# Patient Record
Sex: Female | Born: 1965 | Race: Black or African American | Hispanic: No | Marital: Married | Smoking: Never smoker
Health system: Southern US, Community
[De-identification: ages and names within clinical notes are randomized; demographics above are authoritative.]

## PROBLEM LIST (undated history)

## (undated) DIAGNOSIS — D649 Anemia, unspecified: Secondary | ICD-10-CM

## (undated) DIAGNOSIS — O903 Peripartum cardiomyopathy: Secondary | ICD-10-CM

## (undated) DIAGNOSIS — N939 Abnormal uterine and vaginal bleeding, unspecified: Secondary | ICD-10-CM

## (undated) DIAGNOSIS — C801 Malignant (primary) neoplasm, unspecified: Secondary | ICD-10-CM

## (undated) DIAGNOSIS — I499 Cardiac arrhythmia, unspecified: Secondary | ICD-10-CM

## (undated) DIAGNOSIS — I10 Essential (primary) hypertension: Secondary | ICD-10-CM

## (undated) DIAGNOSIS — R51 Headache: Secondary | ICD-10-CM

## (undated) HISTORY — PX: MULTIPLE TOOTH EXTRACTIONS: SHX2053

## (undated) HISTORY — PX: OTHER SURGICAL HISTORY: SHX169

## (undated) HISTORY — PX: TUBAL LIGATION: SHX77

---

## 1988-05-30 DIAGNOSIS — O903 Peripartum cardiomyopathy: Secondary | ICD-10-CM

## 1988-05-30 HISTORY — DX: Peripartum cardiomyopathy: O90.3

## 2001-01-19 ENCOUNTER — Other Ambulatory Visit: Admission: RE | Admit: 2001-01-19 | Discharge: 2001-01-19 | Payer: Self-pay | Admitting: *Deleted

## 2001-01-24 ENCOUNTER — Encounter: Admission: RE | Admit: 2001-01-24 | Discharge: 2001-01-24 | Payer: Self-pay | Admitting: *Deleted

## 2001-01-24 ENCOUNTER — Encounter: Payer: Self-pay | Admitting: *Deleted

## 2003-09-04 ENCOUNTER — Emergency Department (HOSPITAL_COMMUNITY): Admission: EM | Admit: 2003-09-04 | Discharge: 2003-09-04 | Payer: Self-pay | Admitting: Emergency Medicine

## 2003-10-01 ENCOUNTER — Ambulatory Visit (HOSPITAL_COMMUNITY): Admission: RE | Admit: 2003-10-01 | Discharge: 2003-10-01 | Payer: Self-pay | Admitting: Internal Medicine

## 2003-10-01 ENCOUNTER — Encounter: Payer: Self-pay | Admitting: Cardiology

## 2005-06-27 ENCOUNTER — Emergency Department (HOSPITAL_COMMUNITY): Admission: EM | Admit: 2005-06-27 | Discharge: 2005-06-28 | Payer: Self-pay | Admitting: Emergency Medicine

## 2006-02-24 ENCOUNTER — Encounter: Admission: RE | Admit: 2006-02-24 | Discharge: 2006-02-24 | Payer: Self-pay | Admitting: Family Medicine

## 2006-07-25 ENCOUNTER — Emergency Department (HOSPITAL_COMMUNITY): Admission: EM | Admit: 2006-07-25 | Discharge: 2006-07-25 | Payer: Self-pay | Admitting: Emergency Medicine

## 2008-02-28 IMAGING — CR DG CHEST 2V
2 series · 2 of 2 positions shown · non-contrast
Comparison: 06/27/04.

CLINICAL DATA: Cough.  
 CHEST ? 2 VIEW:

[view not recorded (1 of 2)]
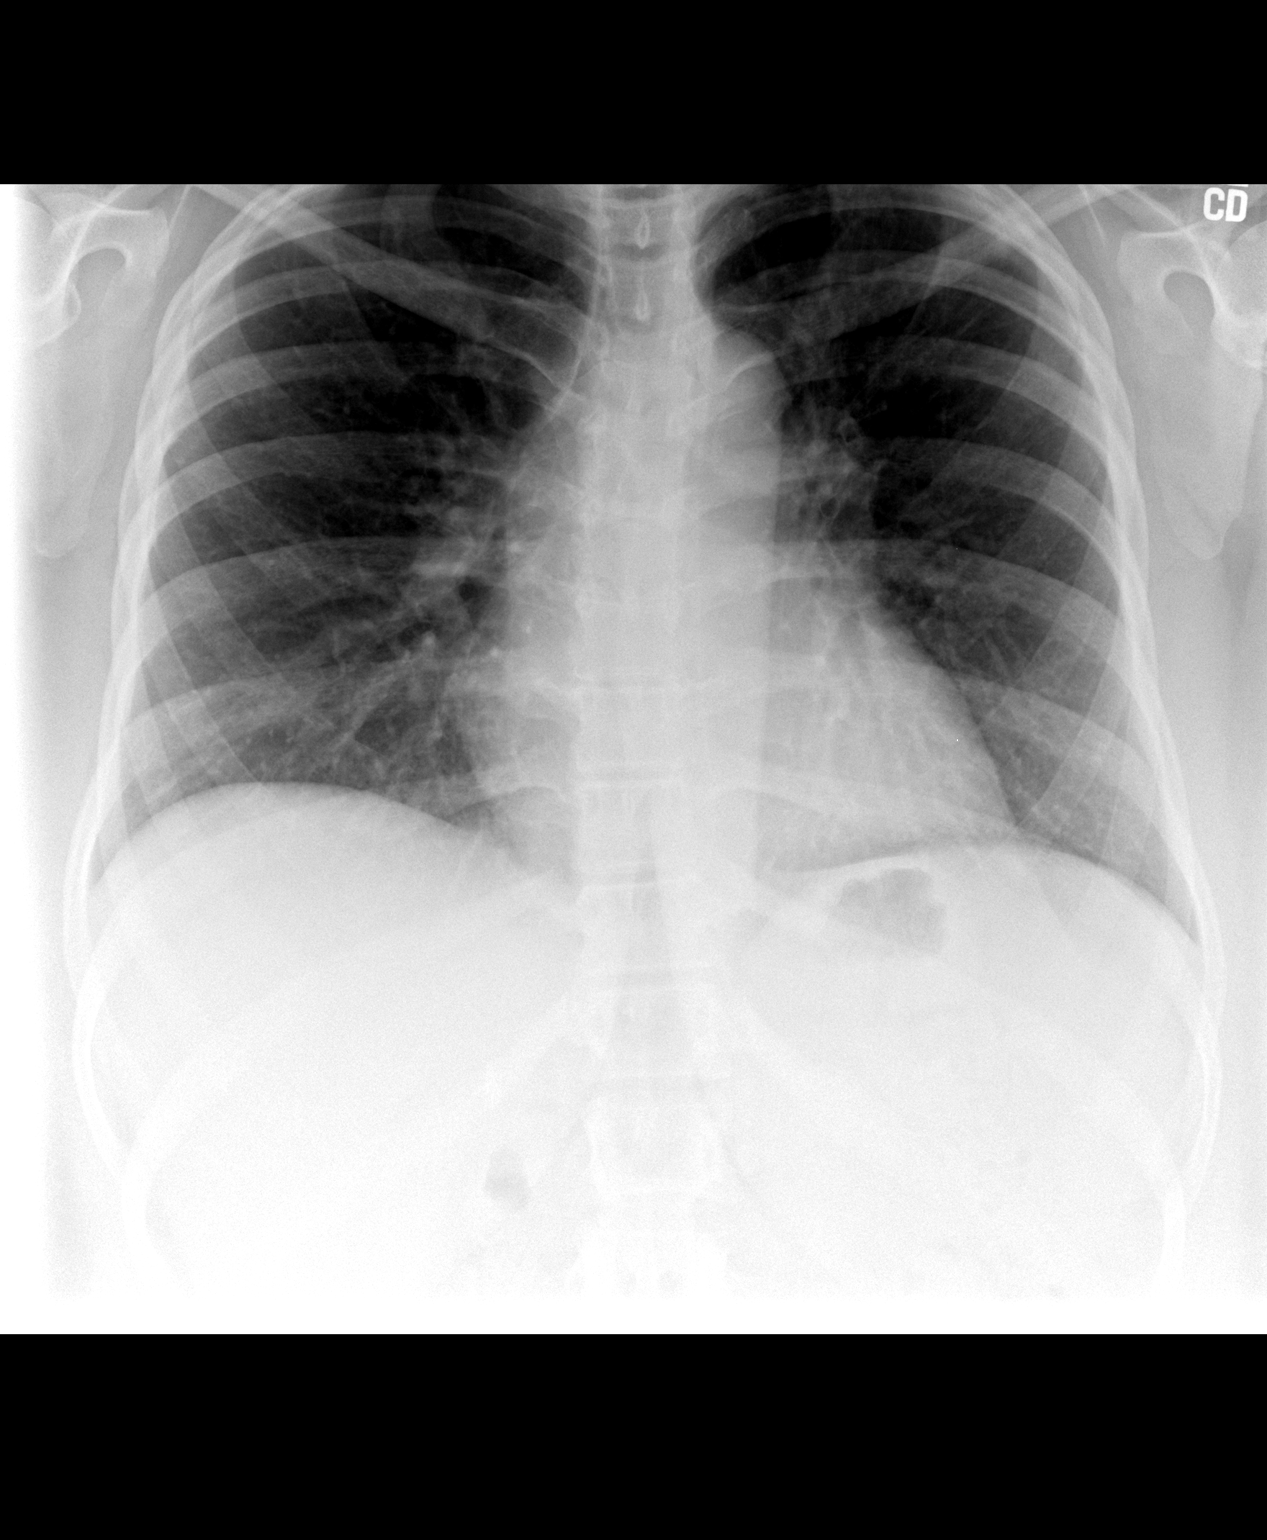

[view not recorded (2 of 2)]
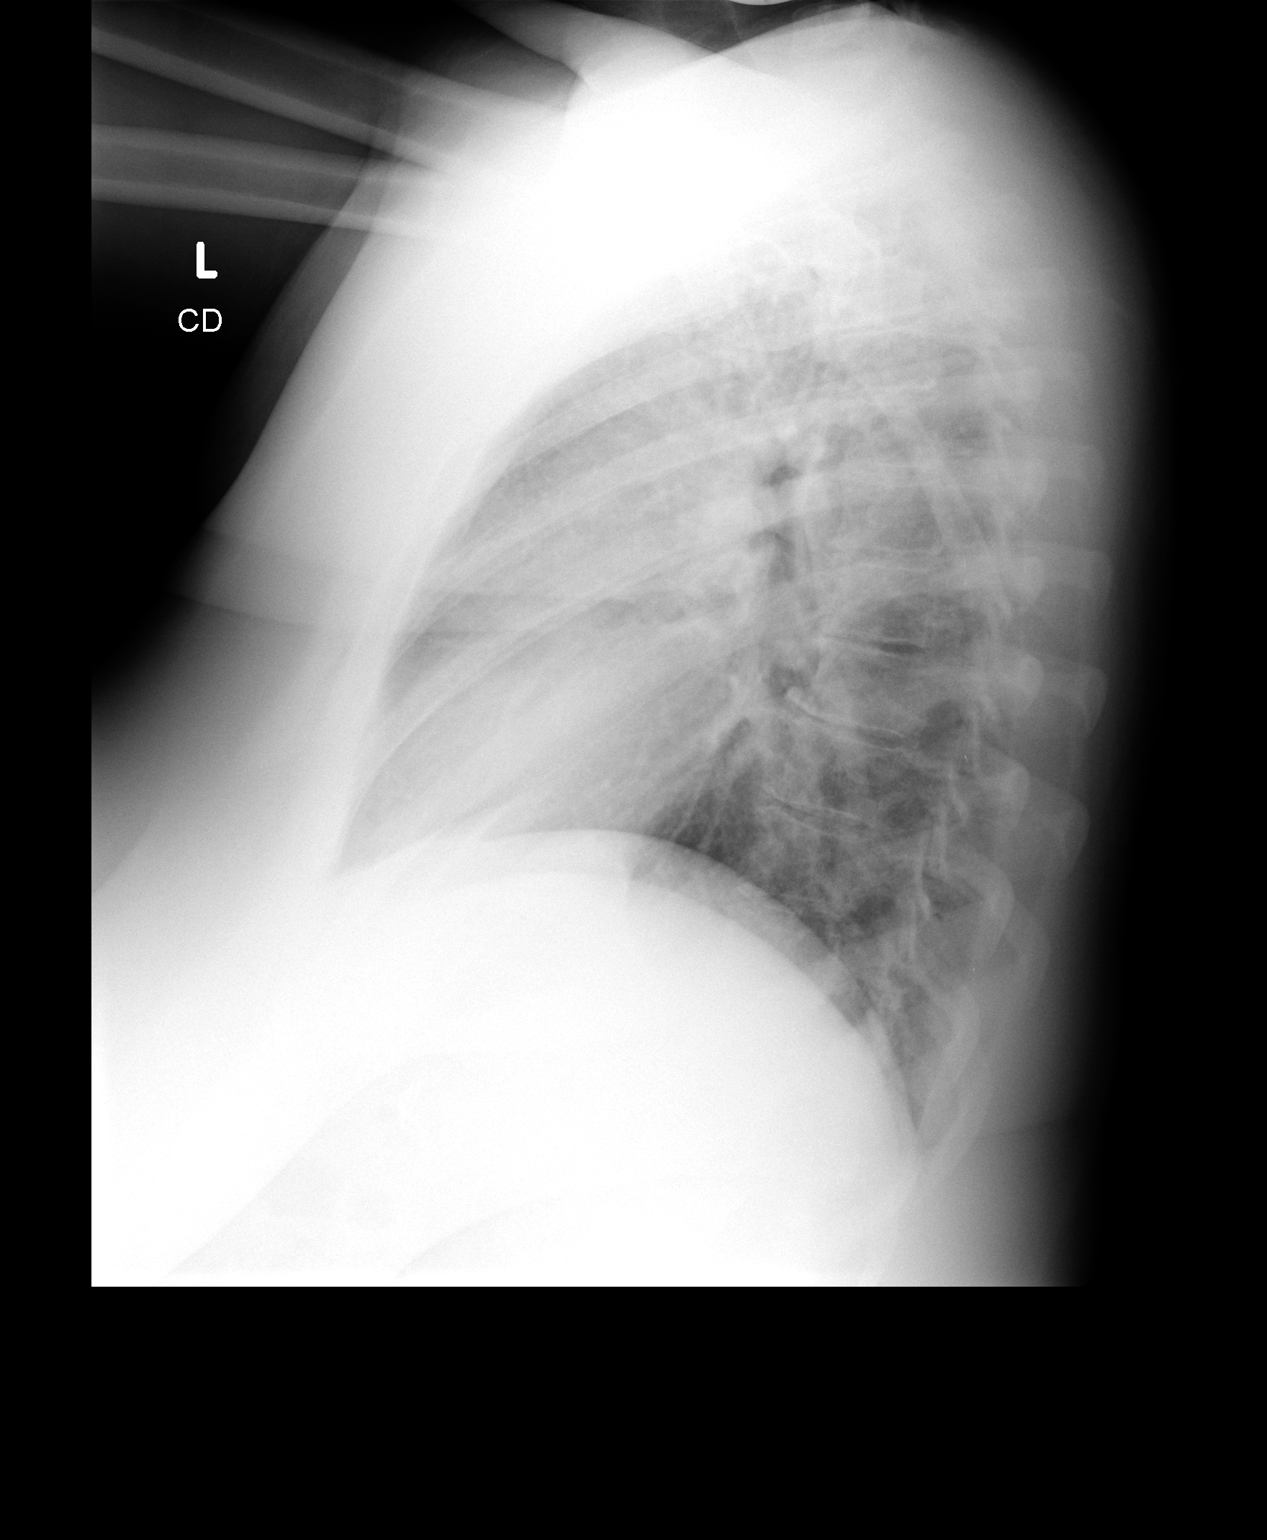

[2 of 2 positions shown; findings below may reference images not displayed]

FINDINGS: Two views of the chest show no pneumonia.  Mild peribronchial thickening is noted which may indicate bronchitis.  The heart is within upper limits of normal.
IMPRESSION: No pneumonia.  Question of bronchitis.

## 2008-06-05 ENCOUNTER — Emergency Department (HOSPITAL_COMMUNITY): Admission: EM | Admit: 2008-06-05 | Discharge: 2008-06-06 | Payer: Self-pay | Admitting: Emergency Medicine

## 2008-09-19 ENCOUNTER — Other Ambulatory Visit: Admission: RE | Admit: 2008-09-19 | Discharge: 2008-09-19 | Payer: Self-pay | Admitting: Family Medicine

## 2009-11-27 ENCOUNTER — Emergency Department (HOSPITAL_COMMUNITY): Admission: EM | Admit: 2009-11-27 | Discharge: 2009-11-28 | Payer: Self-pay | Admitting: Emergency Medicine

## 2010-08-15 LAB — CROSSMATCH: ABO/RH(D): B POS

## 2010-08-15 LAB — CBC
HCT: 20.9 % — ABNORMAL LOW (ref 36.0–46.0)
Hemoglobin: 6.3 g/dL — CL (ref 12.0–15.0)
MCH: 18.2 pg — ABNORMAL LOW (ref 26.0–34.0)
MCHC: 30.4 g/dL (ref 30.0–36.0)
MCV: 59.9 fL — ABNORMAL LOW (ref 78.0–100.0)
Platelets: 385 10*3/uL (ref 150–400)

## 2010-08-15 LAB — WET PREP, GENITAL: Trich, Wet Prep: NONE SEEN

## 2010-08-15 LAB — DIFFERENTIAL
Basophils Relative: 1 % (ref 0–1)
Blasts: 0 %
Lymphocytes Relative: 25 % (ref 12–46)
Lymphs Abs: 1.5 10*3/uL (ref 0.7–4.0)
Neutrophils Relative %: 67 % (ref 43–77)
Promyelocytes Absolute: 0 %
nRBC: 0 /100 WBC

## 2010-08-15 LAB — URINALYSIS, ROUTINE W REFLEX MICROSCOPIC
Bilirubin Urine: NEGATIVE
Ketones, ur: NEGATIVE mg/dL
Protein, ur: NEGATIVE mg/dL

## 2010-08-15 LAB — PROTIME-INR: INR: 1.13 (ref 0.00–1.49)

## 2010-08-15 LAB — POCT I-STAT, CHEM 8
BUN: 13 mg/dL (ref 6–23)
Calcium, Ion: 1.08 mmol/L — ABNORMAL LOW (ref 1.12–1.32)
Creatinine, Ser: 0.8 mg/dL (ref 0.4–1.2)

## 2010-08-15 LAB — POCT PREGNANCY, URINE: Preg Test, Ur: NEGATIVE

## 2010-08-15 LAB — GC/CHLAMYDIA PROBE AMP, GENITAL: GC Probe Amp, Genital: NEGATIVE

## 2010-09-13 LAB — POCT I-STAT, CHEM 8
Chloride: 104 mEq/L (ref 96–112)
Creatinine, Ser: 0.9 mg/dL (ref 0.4–1.2)
Glucose, Bld: 90 mg/dL (ref 70–99)
Hemoglobin: 11.6 g/dL — ABNORMAL LOW (ref 12.0–15.0)
Sodium: 139 mEq/L (ref 135–145)

## 2010-09-13 LAB — POCT CARDIAC MARKERS
CKMB, poc: <1 ng/mL — ABNORMAL LOW (ref 1.0–8.0)
Troponin i, poc: <0.05 ng/mL (ref 0.00–0.09)

## 2011-01-02 ENCOUNTER — Emergency Department (HOSPITAL_COMMUNITY): Payer: Self-pay

## 2011-01-02 ENCOUNTER — Inpatient Hospital Stay (HOSPITAL_COMMUNITY)
Admission: EM | Admit: 2011-01-02 | Discharge: 2011-01-04 | DRG: 812 | Disposition: A | Discharge status: Home / Self Care | Payer: Self-pay | Attending: Internal Medicine | Admitting: Internal Medicine

## 2011-01-02 DIAGNOSIS — D269 Other benign neoplasm of uterus, unspecified: Secondary | ICD-10-CM | POA: Diagnosis present

## 2011-01-02 DIAGNOSIS — D62 Acute posthemorrhagic anemia: Principal | ICD-10-CM | POA: Diagnosis present

## 2011-01-02 DIAGNOSIS — N920 Excessive and frequent menstruation with regular cycle: Secondary | ICD-10-CM | POA: Diagnosis present

## 2011-01-02 DIAGNOSIS — I10 Essential (primary) hypertension: Secondary | ICD-10-CM | POA: Diagnosis present

## 2011-01-02 DIAGNOSIS — D250 Submucous leiomyoma of uterus: Secondary | ICD-10-CM | POA: Diagnosis present

## 2011-01-02 LAB — URINALYSIS, ROUTINE W REFLEX MICROSCOPIC
Leukocytes, UA: NEGATIVE
Nitrite: NEGATIVE
Specific Gravity, Urine: 1.039 — ABNORMAL HIGH (ref 1.005–1.030)
Urobilinogen, UA: 1 mg/dL (ref 0.0–1.0)

## 2011-01-02 LAB — DIFFERENTIAL
Basophils Absolute: 0 10*3/uL (ref 0.0–0.1)
Basophils Relative: 0 % (ref 0–1)
Eosinophils Relative: 1 % (ref 0–5)
Lymphocytes Relative: 18 % (ref 12–46)
Monocytes Absolute: 0.5 10*3/uL (ref 0.1–1.0)
Neutro Abs: 4.8 10*3/uL (ref 1.7–7.7)

## 2011-01-02 LAB — CBC
HCT: 22.1 % — ABNORMAL LOW (ref 36.0–46.0)
Hemoglobin: 6.2 g/dL — CL (ref 12.0–15.0)
MCH: 17 pg — ABNORMAL LOW (ref 26.0–34.0)
MCHC: 28.1 g/dL — ABNORMAL LOW (ref 30.0–36.0)
MCV: 60.5 fL — ABNORMAL LOW (ref 78.0–100.0)
Platelets: 432 K/uL — ABNORMAL HIGH (ref 150–400)
RBC: 3.65 MIL/uL — ABNORMAL LOW (ref 3.87–5.11)
RDW: 19.2 % — ABNORMAL HIGH (ref 11.5–15.5)
WBC: 6.6 K/uL (ref 4.0–10.5)

## 2011-01-02 LAB — HEPATIC FUNCTION PANEL
Albumin: 3.8 g/dL (ref 3.5–5.2)
Alkaline Phosphatase: 82 U/L (ref 39–117)
Total Protein: 7.9 g/dL (ref 6.0–8.3)

## 2011-01-02 LAB — BASIC METABOLIC PANEL
CO2: 25 mEq/L (ref 19–32)
Calcium: 9 mg/dL (ref 8.4–10.5)
Creatinine, Ser: 0.93 mg/dL (ref 0.50–1.10)
Glucose, Bld: 103 mg/dL — ABNORMAL HIGH (ref 70–99)

## 2011-01-02 LAB — GLUCOSE, CAPILLARY: Glucose-Capillary: 98 mg/dL (ref 70–99)

## 2011-01-02 LAB — LACTATE DEHYDROGENASE: LDH: 260 U/L — ABNORMAL HIGH (ref 94–250)

## 2011-01-02 LAB — ABO/RH: ABO/RH(D): B POS

## 2011-01-02 LAB — PREPARE RBC (CROSSMATCH)

## 2011-01-02 LAB — D-DIMER, QUANTITATIVE: D-Dimer, Quant: 1.34 ug{FEU}/mL — ABNORMAL HIGH (ref 0.00–0.48)

## 2011-01-02 LAB — URINE MICROSCOPIC-ADD ON

## 2011-01-02 MED ORDER — IOHEXOL 300 MG/ML  SOLN
100.0000 mL | Freq: Once | INTRAMUSCULAR | Status: AC | PRN
  Administered 2011-01-02: 100 mL via INTRAVENOUS

## 2011-01-03 LAB — CBC
HCT: 29.7 % — ABNORMAL LOW (ref 36.0–46.0)
Hemoglobin: 9.1 g/dL — ABNORMAL LOW (ref 12.0–15.0)
MCH: 18.4 pg — ABNORMAL LOW (ref 26.0–34.0)
MCH: 20.6 pg — ABNORMAL LOW (ref 26.0–34.0)
MCHC: 29 g/dL — ABNORMAL LOW (ref 30.0–36.0)
MCV: 63.3 fL — ABNORMAL LOW (ref 78.0–100.0)
MCV: 67.2 fL — ABNORMAL LOW (ref 78.0–100.0)
Platelets: 395 10*3/uL (ref 150–400)
Platelets: 391 10*3/uL (ref 150–400)
RDW: 21.3 % — ABNORMAL HIGH (ref 11.5–15.5)
WBC: 6.8 10*3/uL (ref 4.0–10.5)

## 2011-01-03 LAB — FERRITIN: Ferritin: 5 ng/mL — ABNORMAL LOW (ref 10–291)

## 2011-01-03 LAB — FOLATE: Folate: 16.9 ng/mL

## 2011-01-03 LAB — VITAMIN B12: Vitamin B-12: 522 pg/mL (ref 211–911)

## 2011-01-03 LAB — BASIC METABOLIC PANEL
BUN: 10 mg/dL (ref 6–23)
Calcium: 8.7 mg/dL (ref 8.4–10.5)
Creatinine, Ser: 0.72 mg/dL (ref 0.50–1.10)
GFR calc Af Amer: >60 mL/min (ref >60)

## 2011-01-03 LAB — MAGNESIUM: Magnesium: 2.4 mg/dL (ref 1.5–2.5)

## 2011-01-03 NOTE — H&P (Signed)
[name] [name], [name] [name]              ACCOUNT NO.:  [id number]  MEDICAL RECORD NO.:  [id number]  LOCATION:  [facility]                         FACILITY:  [facility]  PHYSICIAN:  Tana Felts, MD     DATE OF BIRTH:  1966-02-24  DATE OF ADMISSION:  01/02/2011 DATE OF DISCHARGE:                             HISTORY & PHYSICAL   CHIEF COMPLAINT:  Shortness of breath and general malaise.  HISTORY OF PRESENT ILLNESS:  This is a 45 year old woman with a history of a postpartum cardiomyopathy and hypertension, who presents with a day of shortness of breath, dizziness, and nausea and was found to be anemic with a hemoglobin of 6.2 on admission.  She states that she has never required transfusions in the past and is not known to have a history of severe anemia.  She does have fibroids and has heavy periods lasting for up to 10 days and is currently finishing one today.  She denies any abdominal pain other than some mild left lower quadrant discomfort, which is consistent with her menstrual pain.  She also has occasional epigastric pain, but none currently.  She [place] the shopping [place] and felt short of breath, dizzy when standing as well as nauseated and just generally felt bad.  She notes that when she sits up she also feels dizzy.  She eats a normal diet and denies any blood in her stools, otherwise no recent trauma.  She says that until today she felt fine and had been able to work without any problem.  Of note, she had not been taking her benazepril for blood pressure for a while because of financial issues.  She does not have any insurance.  She takes occasional Advil for pain.  REVIEW OF SYSTEMS:  Otherwise negative except as above.  PAST MEDICAL HISTORY: 1. Hypertension. 2. Postpartum cardiomyopathy in 1990, now resolved. 3. Cholecystectomy. 4. Tubal ligation.  SOCIAL HISTORY:  She does not smoke, has a very occasional drink.  No drug abuse.  No recent travel.  She is married, lives  with her husband and daughter.  FAMILY HISTORY:  She has a niece with sickle cell anemia, but she says none in her immediate family, and several relatives have had cancer.  ALLERGIES:  CODEINE causes nausea and rash.  MEDICATIONS:  She is supposed to be taking benazepril, but has not been. She takes occasional Advil for pain.  PHYSICAL EXAMINATION:  VITAL SIGNS:  Temperature 98.7, pulse 55, blood pressure 173/83, respiratory rate 21, saturating 98% on room air.  She is currently wearing oxygen. GENERAL:  This is an overweight, well-nourished and well-developed woman in no acute distress.  She does appear somewhat pale. HEENT:  Pupils are equal, round, and reactive to light.  Extraocular movements are intact.  There is no scleral icterus or conjunctivitis. She does have pale conjunctiva.  She has moist mucous membranes, which are pale.  Oropharynx is clear.  Moderate dentition. LUNGS:  Clear to auscultation bilaterally. CARDIAC:  Regular rate and rhythm.  No murmurs, rubs or gallops. ABDOMEN:  Soft, very mild tenderness in the left lower quadrant with no guarding.  No masses appreciated.  Normal bowel sounds.  EXTREMITIES: Warm and  well perfused with trace peripheral pedal edema bilaterally. NEUROLOGIC:  She is grossly intact.  No focal deficits. SKIN:  Reveals no rash, other lesions or breakdown.  LABORATORY DATA:  As mentioned, her CBC showed a white count of 6.6, hemoglobin 6.2 with a hematocrit of 22.1, and platelets are 432.  She had some atypical lymphocytes and her red cell morphology reveals tear drop cells, polychromasia, and lymphocytes as well as large platelets. D-dimer was mildly elevated at 1.34.  Creatinine 0.93, otherwise normal electrolytes.  Her bilirubin was 0.7.  Other LFTs unremarkable.  LDH mildly elevated at 260.  She is B+ blood type.  Urinalysis is bland. Cardiac enzymes negative.  RADIOLOGY:  Chest x-ray showed enlarged heart, mild pulmonary  vascular congestion without overt edema.  CT angiogram showed no evidence of PE, stable mild chronic lung disease.  Pelvic and transvaginal ultrasounds revealed progressive margin of her uterus with probable ill-defined submucosal fibroid or adenomyoma probably in the anterior uterine wall, though a primary endometrial process cannot be excluded.  No adnexal mass.  ASSESSMENT:  This is a 45 year old woman, who presents with severe anemia, hemoglobin of 6.2, heavy menstrual bleeding and a likely large fibroid.  Most likely her uterine bleeding is the source of her anemia, although it does seems somewhat sudden in onset.  She does not have any evidence of hemolysis that would be causing it and does not give a history consistent with gastrointestinal bleeding.  PLAN: 1. The patient will be given 2 units of blood tonight.  We will check     iron studies beforehand in the emergency room. 2. She will need a gynecology consult in the morning to evaluate need     for hysterectomy or other treatment for these fibroids as well as     whether or not she would need to be biopsied for possible cancer,      though fibroid seems most likely. 3. I started her back on a low-dose of lisinopril, which can be up     titrated as needed.  She also needs to see social work about     financial issues, so that she can afford her medical care and long-     term blood pressure medications as well as she probably also likely     need iron supplementation based on iron studies. 4. I think at this time she does not need a GI workup, given that     gynecological process is a most likely source of her anemia. 5. Consider echocardiogram to evaluate for residual cardiomyopathy once anemia has resolved.    Tana Felts, MD     NB/MEDQ  D:  01/02/2011  T:  01/02/2011  Job:  [id number]  Electronically Signed by Tana Felts M.D. on 01/03/2011 10:27:55 AM

## 2011-01-04 LAB — TYPE AND SCREEN
ABO/RH(D): B POS
Antibody Screen: NEGATIVE
Unit division: 0

## 2011-01-13 NOTE — Discharge Summary (Signed)
[name], [name] NO.:  [id number]  MEDICAL RECORD NO.:  [id number]  LOCATION:  1517                         FACILITY:  [facility]  PHYSICIAN:  Marinda Elk, M.D.DATE OF BIRTH:  02/28/1966  DATE OF ADMISSION:  01/02/2011 DATE OF DISCHARGE:  01/04/2011                              DISCHARGE SUMMARY   PRIMARY CARE DOCTOR:  None.  DISCHARGE DIAGNOSES: 1. Acute blood loss anemia secondary to #2. 2. Uterine bleed secondary to fibroids.  DISCHARGE MEDICATIONS: 1. Ferrous sulfate 325 mg p.o. daily. 2. Lisinopril 5 mg daily. 3. MiraLax 17 g p.o. daily.  PROCEDURES PERFORMED:  Pelvic ultrasound that showed progressive enlargement of uterus with probably ill-defined submucosal fibroid and adenomyoma involving the anterior uterine wall in correlate with prior study.  The primary endometrial process cannot be excluded in this patient with heavy vaginal bleeding.  CT angiography of the chest that showed no evidence of PE.  Chest x-ray that showed mild cardiomegaly and vascular congestion.  BRIEF ADMITTING HISTORY AND PHYSICAL:  This is a 44 year old female with past medical history of postpartum cardiomyopathy, hypertension, who presents with a shortness of breath, dizziness, nausea, and anemia with a hemoglobin of 6.2 on admission.  She relates that she never required transfusion in the past and is not known to have a history of severe anemia.  She does have fibroids and heavy periods lasting for 10 days and is currently finishing one today.  She denies any chest pain.  She had some mild left lower quadrant discomfort consistent with ventral sprain.  Please refer to dictation from January 02, 2011, for further details.  LABS ON ADMISSION:  Shows D-dimer 1.3.  Sodium 137, potassium 3.3, chloride 103, bicarb 25, glucose of 103, BUN of 13, creatinine 0.9, calcium 9.0.  Her white count is 6.6, hemoglobin of 6.2, platelet count of 432.  ANC of 4.2.  First set of  cardiac enzyme was negative x1.  Her LFTs were within normal limits.  Her LDH is 260.  Imaging results as above.  She also had an EKG that showed normal sinus rhythm with some nonspecific T-wave changes.  HOSPITAL COURSE: 1. Acute blood loss anemia secondary to heavy menstrual bleeding.  She     was admitted to the hospital and was transfused 2 units.  Her     hemoglobin came up to 9.  She was feeling much better.  She relates     her symptoms have resolved.  She was ambulating without any     difficulty.  She was started on ferrous sulfate, which she will     continue.  This chronic process is going on.  Her MCV is 67.  We     have started her on ferrous sulfate, which she will continue and     follow up with a GYN doctor as an outpatient. 2. Uterine bleed probably secondary to uterine fibroids.  Ultrasound     was done with the results above.  FOLLOWUP APPOINTMENTS:  This months, she will follow up with GYN clinic. As an outpatient, she will continue ferrous sulfate as an outpatient.  DISCHARGE VITAL SIGNS:  Temperature 98, pulse 47, respirations of 17, blood  pressure 137/87.  She is saturating 97% on room air.  DISCHARGE LABS:  Shows white count of 6.8, hemoglobin of 9.1, and platelet count of 391.  DISPOSITION:  The patient will follow up with Jabier Mutton with [facility] and the GYN clinic this month, and evaluate her fibroids and see the need for surgery or any further evaluation.     Marinda Elk, M.D.     AF/MEDQ  D:  01/04/2011  T:  01/05/2011  Job:  [id number]  cc:   Clinic [facility] Fax: [phone]  Electronically Signed by Marinda Elk M.D. on 01/13/2011 11:09:40 AM

## 2011-02-25 ENCOUNTER — Encounter: Payer: Self-pay | Admitting: Obstetrics & Gynecology

## 2011-05-31 DIAGNOSIS — I499 Cardiac arrhythmia, unspecified: Secondary | ICD-10-CM

## 2011-05-31 HISTORY — DX: Cardiac arrhythmia, unspecified: I49.9

## 2011-11-12 ENCOUNTER — Inpatient Hospital Stay (HOSPITAL_COMMUNITY)
Admission: EM | Admit: 2011-11-12 | Discharge: 2011-11-16 | DRG: 760 | Disposition: A | Discharge status: Home / Self Care | Payer: Self-pay | Attending: Internal Medicine | Admitting: Internal Medicine

## 2011-11-12 ENCOUNTER — Encounter (HOSPITAL_COMMUNITY): Payer: Self-pay

## 2011-11-12 ENCOUNTER — Emergency Department (HOSPITAL_COMMUNITY): Payer: Self-pay

## 2011-11-12 DIAGNOSIS — R000 Tachycardia, unspecified: Secondary | ICD-10-CM

## 2011-11-12 DIAGNOSIS — R112 Nausea with vomiting, unspecified: Secondary | ICD-10-CM | POA: Diagnosis present

## 2011-11-12 DIAGNOSIS — D62 Acute posthemorrhagic anemia: Secondary | ICD-10-CM | POA: Diagnosis present

## 2011-11-12 DIAGNOSIS — R079 Chest pain, unspecified: Secondary | ICD-10-CM | POA: Diagnosis present

## 2011-11-12 DIAGNOSIS — R072 Precordial pain: Secondary | ICD-10-CM

## 2011-11-12 DIAGNOSIS — N921 Excessive and frequent menstruation with irregular cycle: Secondary | ICD-10-CM | POA: Diagnosis present

## 2011-11-12 DIAGNOSIS — D509 Iron deficiency anemia, unspecified: Secondary | ICD-10-CM | POA: Diagnosis present

## 2011-11-12 DIAGNOSIS — I10 Essential (primary) hypertension: Secondary | ICD-10-CM | POA: Diagnosis present

## 2011-11-12 DIAGNOSIS — I252 Old myocardial infarction: Secondary | ICD-10-CM

## 2011-11-12 DIAGNOSIS — D259 Leiomyoma of uterus, unspecified: Principal | ICD-10-CM | POA: Diagnosis present

## 2011-11-12 HISTORY — DX: Peripartum cardiomyopathy: O90.3

## 2011-11-12 HISTORY — DX: Essential (primary) hypertension: I10

## 2011-11-12 LAB — CARDIAC PANEL(CRET KIN+CKTOT+MB+TROPI)
CK, MB: 1.2 ng/mL (ref 0.3–4.0)
Relative Index: INVALID (ref 0.0–2.5)
Total CK: 78 U/L (ref 7–177)
Troponin I: <0.3 ng/mL (ref <0.30)

## 2011-11-12 LAB — COMPREHENSIVE METABOLIC PANEL
ALT: 14 U/L (ref 0–35)
AST: 22 U/L (ref 0–37)
CO2: 21 mEq/L (ref 19–32)
Calcium: 9.2 mg/dL (ref 8.4–10.5)
Chloride: 105 mEq/L (ref 96–112)
Creatinine, Ser: 0.79 mg/dL (ref 0.50–1.10)
GFR calc Af Amer: >90 mL/min (ref >90)
GFR calc non Af Amer: >90 mL/min (ref >90)
Glucose, Bld: 95 mg/dL (ref 70–99)
Total Bilirubin: 0.6 mg/dL (ref 0.3–1.2)

## 2011-11-12 LAB — T4, FREE: Free T4: 1.07 ng/dL (ref 0.80–1.80)

## 2011-11-12 LAB — URINALYSIS, ROUTINE W REFLEX MICROSCOPIC
Bilirubin Urine: NEGATIVE
Hgb urine dipstick: NEGATIVE
Ketones, ur: NEGATIVE mg/dL
Nitrite: NEGATIVE
Specific Gravity, Urine: 1.016 (ref 1.005–1.030)
pH: 7 (ref 5.0–8.0)

## 2011-11-12 LAB — CBC
Hemoglobin: 8.2 g/dL — ABNORMAL LOW (ref 12.0–15.0)
MCH: 17.6 pg — ABNORMAL LOW (ref 26.0–34.0)
MCV: 62.4 fL — ABNORMAL LOW (ref 78.0–100.0)
RBC: 4.66 MIL/uL (ref 3.87–5.11)
WBC: 4.4 10*3/uL (ref 4.0–10.5)

## 2011-11-12 LAB — RAPID URINE DRUG SCREEN, HOSP PERFORMED
Amphetamines: NOT DETECTED
Barbiturates: NOT DETECTED
Cocaine: NOT DETECTED
Opiates: NOT DETECTED
Tetrahydrocannabinol: NOT DETECTED

## 2011-11-12 LAB — TSH: TSH: 1.619 u[IU]/mL (ref 0.350–4.500)

## 2011-11-12 MED ORDER — MORPHINE SULFATE 2 MG/ML IJ SOLN
1.0000 mg | INTRAMUSCULAR | Status: DC | PRN
Start: 2011-11-12 — End: 2011-11-16
  Administered 2011-11-12 – 2011-11-15 (×3): 1 mg via INTRAVENOUS
  Filled 2011-11-12 (×5): qty 1

## 2011-11-12 MED ORDER — ONDANSETRON HCL 4 MG/2ML IJ SOLN
4.0000 mg | Freq: Four times a day (QID) | INTRAMUSCULAR | Status: DC | PRN
Start: 2011-11-12 — End: 2011-11-16
  Administered 2011-11-12 – 2011-11-13 (×3): 4 mg via INTRAVENOUS
  Filled 2011-11-12 (×3): qty 2

## 2011-11-12 MED ORDER — DILTIAZEM LOAD VIA INFUSION
20.0000 mg | Freq: Once | INTRAVENOUS | Status: AC
  Administered 2011-11-12: 20 mg via INTRAVENOUS
  Filled 2011-11-12: qty 20

## 2011-11-12 MED ORDER — SODIUM CHLORIDE 0.9 % IV SOLN
INTRAVENOUS | Status: DC
  Administered 2011-11-12 – 2011-11-14 (×2): via INTRAVENOUS

## 2011-11-12 MED ORDER — DILTIAZEM HCL 25 MG/5ML IV SOLN: 20.0000 mg | Freq: Once | INTRAVENOUS | Status: DC

## 2011-11-12 MED ORDER — SODIUM CHLORIDE 0.9 % IJ SOLN
3.0000 mL | Freq: Two times a day (BID) | INTRAMUSCULAR | Status: DC
  Administered 2011-11-12 – 2011-11-16 (×5): 3 mL via INTRAVENOUS

## 2011-11-12 MED ORDER — SODIUM CHLORIDE 0.9 % IV BOLUS (SEPSIS)
500.0000 mL | Freq: Once | INTRAVENOUS | Status: AC
  Administered 2011-11-12: 500 mL via INTRAVENOUS

## 2011-11-12 MED ORDER — DILTIAZEM HCL 100 MG IV SOLR
10.0000 mg/h | Freq: Once | INTRAVENOUS | Status: AC
  Administered 2011-11-12: 10 mg/h via INTRAVENOUS
  Filled 2011-11-12: qty 100

## 2011-11-12 MED ORDER — DEXTROSE 5 % IV SOLN
5.0000 mg/h | Freq: Once | INTRAVENOUS | Status: AC
  Administered 2011-11-12: 5 mg/h via INTRAVENOUS
  Filled 2011-11-12: qty 100

## 2011-11-12 MED ORDER — HYDROCODONE-ACETAMINOPHEN 5-325 MG PO TABS
1.0000 | ORAL_TABLET | ORAL | Status: DC | PRN
  Administered 2011-11-13: 1 via ORAL
  Filled 2011-11-12: qty 1

## 2011-11-12 MED ORDER — LORAZEPAM 2 MG/ML IJ SOLN
0.5000 mg | Freq: Once | INTRAMUSCULAR | Status: AC
  Administered 2011-11-12: 0.5 mg via INTRAVENOUS
  Filled 2011-11-12: qty 1

## 2011-11-12 MED ORDER — ACETAMINOPHEN 325 MG PO TABS
650.0000 mg | ORAL_TABLET | Freq: Once | ORAL | Status: AC
  Administered 2011-11-12: 650 mg via ORAL
  Filled 2011-11-12: qty 2

## 2011-11-12 MED ORDER — HYDRALAZINE HCL 20 MG/ML IJ SOLN
10.0000 mg | Freq: Three times a day (TID) | INTRAMUSCULAR | Status: DC
  Administered 2011-11-12: 19:00:00 via INTRAVENOUS
  Administered 2011-11-12: 10 mg via INTRAVENOUS
  Administered 2011-11-13: 14:00:00 via INTRAVENOUS
  Administered 2011-11-13: 10 mg via INTRAVENOUS
  Filled 2011-11-12: qty 1
  Filled 2011-11-12: qty 0.5
  Filled 2011-11-12: qty 1
  Filled 2011-11-12 (×3): qty 0.5
  Filled 2011-11-12: qty 1
  Filled 2011-11-12 (×2): qty 0.5

## 2011-11-12 NOTE — ED Notes (Signed)
Pt in from home with sob palpitations and chest pain states radiates to neck and bilateral arms states pain started in head 0745 then progressed to chest and arms pt has a hx of MI

## 2011-11-12 NOTE — ED Provider Notes (Addendum)
History     CSN: [id number]  Arrival date & time 11/12/11  1346   First MD Initiated Contact with Patient 11/12/11 1402      Chief Complaint  Patient presents with  . Headache  . Palpitations  . Chest Pain    (Consider location/radiation/quality/duration/timing/severity/associated sxs/prior treatment) Patient is a 46 y.o. female presenting with headaches, palpitations, and chest pain. The history is provided by the patient.  Headache  Associated symptoms include palpitations. Pertinent negatives include no fever and no vomiting.  Palpitations  Associated symptoms include chest pain and headaches. Pertinent negatives include no fever, no numbness, no abdominal pain, no vomiting, no back pain, no weakness and no cough.  Chest Pain Primary symptoms include palpitations. Pertinent negatives for primary symptoms include no fever, no cough, no abdominal pain and no vomiting.  Pertinent negatives for associated symptoms include no numbness and no weakness.   pt states onset at rest this morning of chest heaviness, palpitations, and mild sob. States then also had onset generalized headache, gradual onset, diffuse, dull, similar to prior headaches. Cp is diffuse heaviness, non radiating, not pleuritic. No specific exacerbating or alleviating factors. No nv or diaphoresis. States hx cardiomyopathy post delivery of child. Denies hx cad, denies hx dysrhythmia. States had similar symptoms a couple months ago, states no specific diagnosis made then. Denies leg pain or swelling, no immobility, trauma or recent surgery. No pleuritic cp. No hx dvt or pe. Denies fam hx premature cad. Denies heat intolerance, sweats or hx thyroid disease. Denies recent change in meds or new med use. Denies cough or uri c/o. No fever or chills. Pts headache dull diffuse, gradual onset. No eye pain or change in vision. No neck pain or stiffness. Same as prior headaches. No sinus drainage or pain. No change in speech. No  numbness/weakness. No specific exacerbating or alleviating factors. No recent head trauma.   Past Medical History  Diagnosis Date  . MI (myocardial infarction)   . Hypertension     History reviewed. No pertinent past surgical history.  No family history on file.  History  Substance Use Topics  . Smoking status: Never Smoker   . Smokeless tobacco: Not on file  . Alcohol Use: No    OB History    Grav Para Term Preterm Abortions TAB SAB Ect Mult Living                  Review of Systems  Constitutional: Negative for fever and chills.  HENT: Negative for neck pain and neck stiffness.   Eyes: Negative for pain and visual disturbance.  Respiratory: Negative for cough.   Cardiovascular: Positive for chest pain and palpitations. Negative for leg swelling.  Gastrointestinal: Negative for vomiting and abdominal pain.  Genitourinary: Negative for flank pain.  Musculoskeletal: Negative for back pain.  Skin: Negative for rash.  Neurological: Positive for headaches. Negative for weakness and numbness.  Hematological: Does not bruise/bleed easily.  Psychiatric/Behavioral: Negative for confusion.    Allergies  Codeine  Home Medications  No current outpatient prescriptions on file.  BP 159/113  Pulse 162  Resp 20  SpO2 100%  LMP 11/05/2011  Physical Exam  Nursing note and vitals reviewed. Constitutional: She is oriented to person, place, and time. She appears well-developed and well-nourished. No distress.  HENT:  Head: Atraumatic.  Nose: Nose normal.  Mouth/Throat: Oropharynx is clear and moist.       No sinus or temporal tenderness  Eyes: Conjunctivae and EOM are normal. Pupils  are equal, round, and reactive to light. No scleral icterus.  Neck: Normal range of motion. Neck supple. No tracheal deviation present. No thyromegaly present.       No stiffness or rigidity  Cardiovascular: Regular rhythm, normal heart sounds and intact distal pulses.  Exam reveals no gallop  and no friction rub.   No murmur heard.      tachycardic  Pulmonary/Chest: Effort normal and breath sounds normal. No respiratory distress.  Abdominal: Soft. Normal appearance and bowel sounds are normal. She exhibits no distension. There is no tenderness.       obese  Genitourinary:       No cva tenderness  Musculoskeletal: She exhibits no edema and no tenderness.  Neurological: She is alert and oriented to person, place, and time.  Skin: Skin is warm and dry. No rash noted.  Psychiatric:       Pt anxious.     ED Course  Procedures (including critical care time)   Labs Reviewed  CBC  COMPREHENSIVE METABOLIC PANEL  TROPONIN I  TSH  T4, FREE  URINALYSIS, ROUTINE W REFLEX MICROSCOPIC  PREGNANCY, URINE   Results for orders placed during the hospital encounter of 11/12/11  CBC      Component Value Range   WBC 4.4  4.0 - 10.5 K/uL   RBC 4.66  3.87 - 5.11 MIL/uL   Hemoglobin 8.2 (*) 12.0 - 15.0 g/dL   HCT 16.1 (*) 09.6 - 04.5 %   MCV 62.4 (*) 78.0 - 100.0 fL   MCH 17.6 (*) 26.0 - 34.0 pg   MCHC 28.2 (*) 30.0 - 36.0 g/dL   RDW 40.9 (*) 81.1 - 91.4 %   Platelets 400  150 - 400 K/uL  COMPREHENSIVE METABOLIC PANEL      Component Value Range   Sodium 141  135 - 145 mEq/L   Potassium 3.5  3.5 - 5.1 mEq/L   Chloride 105  96 - 112 mEq/L   CO2 21  19 - 32 mEq/L   Glucose, Bld 95  70 - 99 mg/dL   BUN 11  6 - 23 mg/dL   Creatinine, Ser 7.82  0.50 - 1.10 mg/dL   Calcium 9.2  8.4 - 95.6 mg/dL   Total Protein 8.3  6.0 - 8.3 g/dL   Albumin 4.0  3.5 - 5.2 g/dL   AST 22  0 - 37 U/L   ALT 14  0 - 35 U/L   Alkaline Phosphatase 87  39 - 117 U/L   Total Bilirubin 0.6  0.3 - 1.2 mg/dL   GFR calc non Af Amer >90  >90 mL/min   GFR calc Af Amer >90  >90 mL/min  TROPONIN I      Component Value Range   Troponin I <0.30  <0.30 ng/mL   Dg Chest Port 1 View  11/12/2011  *RADIOLOGY REPORT*  Clinical Data: Chest pain, shortness of breath  PORTABLE CHEST - 1 VIEW  Comparison: 01/02/2011;  07/25/2006; chest CT - 01/02/2011  Findings: Grossly unchanged enlarged cardiac silhouette and mediastinal contours.  No focal airspace opacities.  No pleural effusion or pneumothorax.  Grossly unchanged bones.  IMPRESSION: No acute cardiopulmonary disease on this portable AP examination  Original Report Authenticated By: Waynard Reeds, M.D.       MDM  Iv ns. Monitor. Oxygen [place]. Ecg. Labs.   Hr 150, narrow complex tachy. cardizem bolus/gtt.     Date: 11/12/2011  Rate: 158  Rhythm: narrow complex tachycardia  QRS Axis: normal  Intervals: normal  ST/T Wave abnormalities: nonspecific ST/T changes  Conduction Disutrbances:lvh  Narrative Interpretation:   Old EKG Reviewed: changes noted   Post cardizem bolus and gtt conversion to nsr. Repeat ecg.   Recheck no cp.  Given recent episodes cp, narrow complex tachy/?afib (now sinus), will admit.  Pt states no pcp/[facility] called.    Date: 11/12/2011  Rate: 91  Rhythm: normal sinus rhythm  QRS Axis: normal  Intervals: normal  ST/T Wave abnormalities: normal  Conduction Disutrbances:lvh  Narrative Interpretation:   Old EKG Reviewed: changes noted    CRITICAL CARE Performed by: Suzi Roots   Total critical care time: 35  Critical care time was exclusive of separately billable procedures and treating other patients.  Critical care was necessary to treat or prevent imminent or life-threatening deterioration.  Critical care was time spent personally by me on the following activities: development of treatment plan with patient and/or surrogate as well as nursing, discussions with consultants, evaluation of patient's response to treatment, examination of patient, obtaining history from patient or surrogate, ordering and performing treatments and interventions, ordering and review of laboratory studies, ordering and review of radiographic studies, pulse oximetry and re-evaluation of patient's condition.     Suzi Roots,  MD 11/12/11 1519  Suzi Roots, MD 11/12/11 [phone]

## 2011-11-12 NOTE — H&P (Addendum)
[facility] History and Physical  [name] [name] JXB:[id number] DOB: September 20, 1965 DOA: 11/12/2011  Referring physician: Dr. Denton Lank, ED PCP: No primary provider on file.   Chief Complaint: Chest pain  HPI:  Pt is 46 yo female who presents to [facility] ED with main concern of sudden onset, substernal chest pain, that initially started the morning of admission and has been persistent. Pt describes pain as dull, occasionally but not consistently radiating to the back are and associated with palpitations and shortness of breath. Pt reports similar episodes in the past and denies any other specific aggravating or alleviating factors. Pt denies any specific abdominal or urinary concerns, no orthopnea and no lower extremity swelling. No recent sicknesses or hospitalizations. Pt also denies fevers, chills, other systemic symptoms.   Review of Systems:   Constitutional: Negative for fever, chills and malaise/fatigue. Negative for diaphoresis.  HENT: Negative for hearing loss, ear pain, nosebleeds, congestion, sore throat, neck pain, tinnitus and ear discharge.   Eyes: Negative for blurred vision, double vision, photophobia, pain, discharge and redness.  Respiratory: Negative for cough, hemoptysis, sputum production, shortness of breath, wheezing and stridor.   Cardiovascular: Negative for orthopnea, claudication and leg swelling.  Gastrointestinal: Negative for nausea, vomiting and abdominal pain. Negative for heartburn, constipation, blood in stool and melena.  Genitourinary: Negative for dysuria, urgency, frequency, hematuria and flank pain.  Musculoskeletal: Negative for myalgias, back pain, joint pain and falls.  Skin: Negative for itching and rash.  Neurological: Negative for dizziness and weakness. Negative for tingling, tremors, sensory change, speech change, focal weakness, loss of consciousness and headaches.  Endo/Heme/Allergies: Negative for environmental allergies and polydipsia. Does not  bruise/bleed easily.  Psychiatric/Behavioral: Negative for suicidal ideas. The patient is not nervous/anxious.      Past Medical History  Diagnosis Date  . MI (myocardial infarction)   . Hypertension    History reviewed. No pertinent past surgical history. Social History:  reports that she has never smoked. She does not have any smokeless tobacco history on file. She reports that she does not drink alcohol. Her drug history not on file.  Allergies  Allergen Reactions  . Codeine     No family history on file.  Prior to Admission medications   Not on File   Physical Exam: Filed Vitals:   11/12/11 1407 11/12/11 1456 11/12/11 1501 11/12/11 1507  BP: 159/113 154/115 156/107 132/84  Pulse: 162   141  Temp:  99 F (37.2 C)    TempSrc:  Oral    Resp: 20 17 21 22   SpO2: 100%   100%     General:  NAD, follows commands appropriately  Eyes: PERRLA, EOMI  ENT: no OP erythema  Neck: supple, no carotid bruits  Cardiovascular: Irregular rate and rhythm, no murmurs  Respiratory: clear to auscultation bilaterally, no wheezing and no crackles  Abdomen: Soft, non tender and non distended, BS +  Skin: normal turgor and no rash  Psychiatric: normal affect  Neurologic: Grossly nonfocal  Labs on Admission:  Basic Metabolic Panel:  Lab 11/12/11 1308  NA 141  K 3.5  CL 105  CO2 21  GLUCOSE 95  BUN 11  CREATININE 0.79  CALCIUM 9.2   Liver Function Tests:  Lab 11/12/11 1426  AST 22  ALT 14  ALKPHOS 87  BILITOT 0.6  PROT 8.3  ALBUMIN 4.0   CBC:  Lab 11/12/11 1426  WBC 4.4  HGB 8.2*  HCT 29.1*  MCV 62.4*  PLT 400   Cardiac Enzymes:  Lab 11/12/11 1427  CKTOTAL --  CKMB --  CKMBINDEX --  TROPONINI <0.30    Radiological Exams on Admission: Dg Chest Port 1 View  11/12/2011  *RADIOLOGY REPORT*  Clinical Data: Chest pain, shortness of breath  PORTABLE CHEST - 1 VIEW  Comparison: 01/02/2011; 07/25/2006; chest CT - 01/02/2011  Findings: Grossly unchanged  enlarged cardiac silhouette and mediastinal contours.  No focal airspace opacities.  No pleural effusion or pneumothorax.  Grossly unchanged bones.  IMPRESSION: No acute cardiopulmonary disease on this portable AP examination  Original Report Authenticated By: Waynard Reeds, M.D.    EKG: ST depression noted in anterior leads  Assessment/Plan  Chest pain - will admit pt to telemetry unit for further evaluation and management - will obtain repeat EKG, cycle CE's, check TSH and electrolyte panel - will check UDS - provide supportive care, analgesia as needed, oxygen as needed  Anemia - unclear etiology - check anemia panel - CBC in AM  HTN, uncontrolled and accelerated - will initiate hydralazine IV for now and readjust the regimen as indicated  Code Status: Full Family Communication: Pt at bedside Disposition Plan: Plan d/c after 24 hours if rules out for ACS  Debbora Presto, MD  [facility] Pager [phone]  If 7PM-7AM, please contact night-coverage [email or web address] Password [facility] 11/12/2011, 3:34 PM

## 2011-11-12 NOTE — ED Notes (Signed)
Report called to Annie, RN.

## 2011-11-13 ENCOUNTER — Inpatient Hospital Stay (HOSPITAL_COMMUNITY): Payer: Self-pay

## 2011-11-13 DIAGNOSIS — R072 Precordial pain: Secondary | ICD-10-CM

## 2011-11-13 DIAGNOSIS — I10 Essential (primary) hypertension: Secondary | ICD-10-CM

## 2011-11-13 DIAGNOSIS — R112 Nausea with vomiting, unspecified: Secondary | ICD-10-CM

## 2011-11-13 LAB — VITAMIN B12: Vitamin B-12: 516 pg/mL (ref 211–911)

## 2011-11-13 LAB — BASIC METABOLIC PANEL
BUN: 8 mg/dL (ref 6–23)
Chloride: 102 mEq/L (ref 96–112)
Glucose, Bld: 110 mg/dL — ABNORMAL HIGH (ref 70–99)
Potassium: 3.5 mEq/L (ref 3.5–5.1)

## 2011-11-13 LAB — RETICULOCYTES
RBC.: 4.26 MIL/uL (ref 3.87–5.11)
Retic Ct Pct: 1.2 % (ref 0.4–3.1)

## 2011-11-13 LAB — FERRITIN: Ferritin: 2 ng/mL — ABNORMAL LOW (ref 10–291)

## 2011-11-13 LAB — CARDIAC PANEL(CRET KIN+CKTOT+MB+TROPI)
CK, MB: 1.6 ng/mL (ref 0.3–4.0)
Relative Index: INVALID (ref 0.0–2.5)
Total CK: 64 U/L (ref 7–177)

## 2011-11-13 LAB — IRON AND TIBC
Saturation Ratios: 2 % — ABNORMAL LOW (ref 20–55)
TIBC: 494 ug/dL — ABNORMAL HIGH (ref 250–470)

## 2011-11-13 LAB — PREPARE RBC (CROSSMATCH)

## 2011-11-13 LAB — CBC
HCT: 25.3 % — ABNORMAL LOW (ref 36.0–46.0)
Hemoglobin: 7.4 g/dL — ABNORMAL LOW (ref 12.0–15.0)
MCH: 18.4 pg — ABNORMAL LOW (ref 26.0–34.0)
MCHC: 29.2 g/dL — ABNORMAL LOW (ref 30.0–36.0)
RBC: 4.03 MIL/uL (ref 3.87–5.11)

## 2011-11-13 LAB — GLUCOSE, CAPILLARY: Glucose-Capillary: 108 mg/dL — ABNORMAL HIGH (ref 70–99)

## 2011-11-13 LAB — FOLATE: Folate: 16.4 ng/mL

## 2011-11-13 MED ORDER — K PHOS MONO-SOD PHOS DI & MONO 155-852-130 MG PO TABS
500.0000 mg | ORAL_TABLET | Freq: Four times a day (QID) | ORAL | Status: AC
  Administered 2011-11-13 (×2): 500 mg via ORAL
  Filled 2011-11-13 (×5): qty 2

## 2011-11-13 MED ORDER — PROMETHAZINE HCL 25 MG/ML IJ SOLN
12.5000 mg | Freq: Four times a day (QID) | INTRAMUSCULAR | Status: DC | PRN
  Administered 2011-11-13 – 2011-11-15 (×2): 12.5 mg via INTRAVENOUS
  Filled 2011-11-13 (×2): qty 1

## 2011-11-13 MED ORDER — IOHEXOL 300 MG/ML  SOLN
100.0000 mL | Freq: Once | INTRAMUSCULAR | Status: AC | PRN
  Administered 2011-11-13: 100 mL via INTRAVENOUS

## 2011-11-13 MED ORDER — KETOROLAC TROMETHAMINE 30 MG/ML IJ SOLN
30.0000 mg | Freq: Four times a day (QID) | INTRAMUSCULAR | Status: DC | PRN
  Administered 2011-11-13 – 2011-11-16 (×6): 30 mg via INTRAVENOUS
  Filled 2011-11-13 (×5): qty 1

## 2011-11-13 MED ORDER — LISINOPRIL 20 MG PO TABS
20.0000 mg | ORAL_TABLET | Freq: Every day | ORAL | Status: DC
  Administered 2011-11-13 – 2011-11-14 (×2): 20 mg via ORAL
  Filled 2011-11-13 (×2): qty 1

## 2011-11-13 NOTE — Progress Notes (Signed)
Call to Dr. Izola Price for phenergan order as pt vomited 200cc, clear emesis. VSS. Hydralazine DC'd, Dr. Izola Price states she will enter other BP meds. Pt's mother at bedside and states she wants pt's GYN status assessed as pt has had very heavy menstrual cycles with fibroids.

## 2011-11-13 NOTE — Progress Notes (Signed)
Patient ID: [name] [name], female   DOB: Dec 19, 1965, 46 y.o.   MRN: [id number]  [facility] PROGRESS NOTE  [name] [name] WUJ:[id number] DOB: [birth year] DOA: 11/12/2011 PCP: No primary provider on file.  Brief narrative: Pt is 46 yo female who presented initially to [facility] ED 11/12/2011 with main concern of substernal chest pain. In addition, she has experiencing non blood vomiting associated with generalized abdominal pain, nausea and poor oral intake.   Consultants:  none  Procedures:  none  Antibiotics:  none  HPI/Subjective: Pt reports nausea and has vomited this morning.   Objective: Filed Vitals:   11/13/11 1600 11/13/11 1700 11/13/11 1800 11/13/11 1830  BP: 167/75 162/99 150/88 153/93  Pulse: 82 88 82 84  Temp: 98.2 F (36.8 C) 97.6 F (36.4 C) 97.7 F (36.5 C) 97.6 F (36.4 C)  TempSrc: Oral Oral Oral Oral  Resp: 18 18 18 18   Height:      Weight:      SpO2:        Intake/Output Summary (Last 24 hours) at 11/13/11 1903 Last data filed at 11/13/11 1830  Gross per 24 hour  Intake 1955.5 ml  Output   2655 ml  Net -699.5 ml    Exam:   General:  Pt is alert, follows commands appropriately, not in acute distress  Cardiovascular: Regular rate and rhythm, S1/S2, no murmurs, no rubs, no gallops  Respiratory: Clear to auscultation bilaterally, no wheezing, no crackles, no rhonchi  Abdomen: Soft, tender in epigastric area, non distended, bowel sounds present, no guarding  Extremities: No edema, pulses DP and PT palpable bilaterally  Neuro: Grossly nonfocal  Data Reviewed: Basic Metabolic Panel: Lab 11/13/11 0700 11/12/11 1426  NA 136 141  K 3.5 3.5  CL 102 105  CO2 24 21  GLUCOSE 110* 95  BUN 8 11  CREATININE 0.72 0.79  CALCIUM 8.7 9.2  MG 2.2 --  PHOS 1.5 --   Liver Function Tests: Lab 11/12/11 1426  AST 22  ALT 14  ALKPHOS 87  BILITOT 0.6  PROT 8.3  ALBUMIN 4.0    CBC: Lab 11/13/11 0700 11/12/11 1426  WBC 5.9 4.4  HGB 7.4* 8.2*    HCT 25.3* 29.1*  MCV 62.8* 62.4*  PLT 343 400   Cardiac Enzymes: Lab 11/13/11 0700 11/12/11 2353 11/12/11 1430 11/12/11 1427  CKTOTAL 58 64 78 --  CKMB 1.6 1.7 1.2 --  TROPONINI <0.30 <0.30 <0.30 <0.30   CBG: Lab 11/13/11 0819  GLUCAP 108*      Studies:  Ct Abdomen Pelvis W Contrast 11/13/2011   IMPRESSION:  No acute intra-abdominal or pelvic pathology.   Dg Chest Port 1 View 11/12/2011   IMPRESSION:  No acute cardiopulmonary disease on this portable AP examination    Scheduled Meds:   . phosphorus  500 mg Oral QID   Continuous Infusions:   . sodium chloride 75 mL/hr at 11/12/11 2138     Assessment/Plan:  Chest Pain, musculoskeletal in etiology - unclear etiology but resolved this AM - please note that CE's x 3 are all within normal limits, no events on telemetry - ACS ruled out - continue supportive care for now  HTN, uncontrolled - will start Lisinopril and will readjust the regimen as indicated  Nausea and Vomiting - unclear etiology - CT abdomen and pelvis negative for acute findings - will continue supportive care - d/c hydralazine  Anemia of chronic disease - proceed with 2 U PRBC transfusion - anemia panel - CBC in AM  Code Status: Full Family Communication: Pt at bedside Disposition Plan: Home when medically stable  [name] Presto, MD  [facility] Pager [phone]  If 7PM-7AM, please contact night-coverage [email or web address] Password [email or web address] 11/13/2011, 7:03 PM   LOS: 1 day

## 2011-11-13 NOTE — Progress Notes (Signed)
Pt became nauseous and vomited small amount before blood started. Zofran given. Resting with mom at bedside. VSS, blood started.

## 2011-11-13 NOTE — Progress Notes (Signed)
Phenergan adm, mother and dgt at bedside, pt resting quietly. Pt did start her menstrual cycle-just had a cycle 11/05/11.

## 2011-11-13 NOTE — Progress Notes (Signed)
Resting quietly, good results with Phenergen. Dr. Izola Price states she will enter PO BP medication.

## 2011-11-14 DIAGNOSIS — R072 Precordial pain: Secondary | ICD-10-CM

## 2011-11-14 DIAGNOSIS — I10 Essential (primary) hypertension: Secondary | ICD-10-CM

## 2011-11-14 DIAGNOSIS — R112 Nausea with vomiting, unspecified: Secondary | ICD-10-CM

## 2011-11-14 LAB — PHOSPHORUS: Phosphorus: 3.2 mg/dL (ref 2.3–4.6)

## 2011-11-14 LAB — TYPE AND SCREEN
Antibody Screen: NEGATIVE
Unit division: 0

## 2011-11-14 LAB — BASIC METABOLIC PANEL
CO2: 23 mEq/L (ref 19–32)
Chloride: 101 mEq/L (ref 96–112)
GFR calc Af Amer: >90 mL/min (ref >90)
Potassium: 2.9 mEq/L — ABNORMAL LOW (ref 3.5–5.1)

## 2011-11-14 LAB — CBC
Platelets: 359 10*3/uL (ref 150–400)
RBC: 4.85 MIL/uL (ref 3.87–5.11)
RDW: 22.3 % — ABNORMAL HIGH (ref 11.5–15.5)
WBC: 6 10*3/uL (ref 4.0–10.5)

## 2011-11-14 MED ORDER — POTASSIUM CHLORIDE 10 MEQ/100ML IV SOLN
10.0000 meq | INTRAVENOUS | Status: AC
  Administered 2011-11-14 (×3): 10 meq via INTRAVENOUS
  Filled 2011-11-14 (×3): qty 100

## 2011-11-14 MED ORDER — FERROUS SULFATE 325 (65 FE) MG PO TABS
325.0000 mg | ORAL_TABLET | Freq: Two times a day (BID) | ORAL | Status: DC
  Administered 2011-11-14 – 2011-11-16 (×6): 325 mg via ORAL
  Filled 2011-11-14 (×7): qty 1

## 2011-11-14 MED ORDER — POTASSIUM CHLORIDE CRYS ER 20 MEQ PO TBCR
40.0000 meq | EXTENDED_RELEASE_TABLET | Freq: Once | ORAL | Status: AC
  Administered 2011-11-14: 40 meq via ORAL
  Filled 2011-11-14: qty 2

## 2011-11-14 MED ORDER — LISINOPRIL 40 MG PO TABS
40.0000 mg | ORAL_TABLET | Freq: Every day | ORAL | Status: DC
  Administered 2011-11-15 – 2011-11-16 (×2): 40 mg via ORAL
  Filled 2011-11-14 (×2): qty 1

## 2011-11-14 MED ORDER — ASPIRIN-ACETAMINOPHEN-CAFFEINE 250-250-65 MG PO TABS
1.0000 | ORAL_TABLET | Freq: Three times a day (TID) | ORAL | Status: DC | PRN
  Administered 2011-11-14 – 2011-11-16 (×2): 1 via ORAL
  Filled 2011-11-14 (×4): qty 1

## 2011-11-14 NOTE — Progress Notes (Signed)
Patient ID: [name] [name], female   DOB: 08-13-1965, 46 y.o.   MRN: [id number]  [facility] PROGRESS NOTE  [name] VHQ:[id number] DOB: 29-Apr-1966 DOA: 11/12/2011 PCP: No primary provider on file.  HPI/Subjective: Pt reports feeling better, denies any chest pain.   Objective: Filed Vitals:   11/13/11 2145 11/14/11 0010 11/14/11 0532 11/14/11 1319  BP: 173/111 134/86 125/86 152/100  Pulse: 89 91 74 63  Temp: 98.3 F (36.8 C) 98.8 F (37.1 C) 98.5 F (36.9 C) 98.2 F (36.8 C)  TempSrc: Oral Oral Oral Oral  Resp: 18 16 16 18   Height:      Weight:      SpO2:  95% 96% 98%    Intake/Output Summary (Last 24 hours) at 11/14/11 2045 Last data filed at 11/14/11 1736  Gross per 24 hour  Intake 2846.25 ml  Output   1600 ml  Net 1246.25 ml    Exam:   General:  Pt is alert, follows commands appropriately, not in acute distress  Cardiovascular: Regular rate and rhythm, S1/S2, no murmurs, no rubs, no gallops  Respiratory: Clear to auscultation bilaterally, no wheezing, no crackles, no rhonchi  Abdomen: Soft, non tender, non distended, bowel sounds present, no guarding  Extremities: No edema, pulses DP and PT palpable bilaterally  Neuro: Grossly nonfocal  Data Reviewed: Basic Metabolic Panel:  Lab 11/14/11 4132 11/13/11 0700 11/12/11 1430 11/12/11 1426  NA 136 136 -- 141  K 2.9* 3.5 -- 3.5  CL 101 102 -- 105  CO2 23 24 -- 21  GLUCOSE 102* 110* -- 95  BUN 7 8 -- 11  CREATININE 0.79 0.72 -- 0.79  CALCIUM 8.6 8.7 -- 9.2  MG -- -- 2.2 --  PHOS 3.2 -- 1.5* --   Liver Function Tests:  Lab 11/12/11 1426  AST 22  ALT 14  ALKPHOS 87  BILITOT 0.6  PROT 8.3  ALBUMIN 4.0   CBC:  Lab 11/14/11 0157 11/13/11 0700 11/12/11 1426  WBC 6.0 5.9 4.4  NEUTROABS -- -- --  HGB 9.5* 7.4* 8.2*  HCT 32.3* 25.3* 29.1*  MCV 66.6* 62.8* 62.4*  PLT 359 343 400   Cardiac Enzymes:  Lab 11/13/11 0700 11/12/11 2353 11/12/11 1430 11/12/11 1427  CKTOTAL 58 64 78 --  CKMB  1.6 1.7 1.2 --  CKMBINDEX -- -- -- --  TROPONINI <0.30 <0.30 <0.30 <0.30   BNP: No components found with this basename: POCBNP:5 CBG:  Lab 11/13/11 0819  GLUCAP 108*    No results found for this or any previous visit (from the past 240 hour(s)).   Studies: Ct Abdomen Pelvis W Contrast  11/13/2011  *RADIOLOGY REPORT*  Clinical Data: Abdominal pain, vomiting  CT ABDOMEN AND PELVIS WITH CONTRAST  Technique:  Multidetector CT imaging of the abdomen and pelvis was performed following the standard protocol during bolus administration of intravenous contrast.  Contrast: OMNIPAQUE IOHEXOL 300 MG/ML  SOLN  Comparison: 11/28/2009  Findings: New minimal patchy probable atelectasis noted at the lung bases dependently.  Cholecystectomy clips noted. Examination is degraded by patient motion.  Adrenal glands, kidneys, spleen, and pancreas are normal. Small porta hepatis lymph nodes are noted, largest 0.7 cm image 23. No ascites or free air.  Right lower quadrant surgical clip from presumed appendectomy noted.  No bowel wall thickening. Fusiform mass-like enlargement of the anterior uterine fundus/body again noted, likely previously seen fibroid versus focal adenomyosis.  Ovaries are normal.  No pelvic free fluid.  The bladder is normal.  No  acute osseous finding.  Lumbarization of S1 noted.  IMPRESSION: No acute intra-abdominal or pelvic pathology.  Original Report Authenticated By: Harrel Lemon, M.D.    Scheduled Meds:   . ferrous sulfate  325 mg Oral BID WC  . lisinopril  20 mg Oral Daily  . phosphorus  500 mg Oral QID  . potassium chloride  10 mEq Intravenous Q1 Hr x 3  . potassium chloride  40 mEq Oral Once  . sodium chloride  3 mL Intravenous Q12H   Continuous Infusions:   . sodium chloride 75 mL/hr at 11/14/11 0547     Assessment/Plan:  Chest Pain, musculoskeletal in etiology  - unclear etiology but resolved this AM  - please note that CE's x 3 are all within normal limits, no  events on telemetry  - ACS ruled out  - continue supportive care for now   HTN, uncontrolled  - will start Lisinopril and will readjust the regimen as indicated  - add Norvasc  Nausea and Vomiting  - unclear etiology  - CT abdomen and pelvis negative for acute findings  - will continue supportive care  - d/c hydralazine   Anemia of chronic disease  - s/p 2 U PRBC transfusion  - anemia panel  - CBC in AM   Code Status: Full  Family Communication: Pt at bedside  Disposition Plan: Home when medically stable   Debbora Presto, MD  [facility] Pager [phone]  If 7PM-7AM, please contact night-coverage [email or web address] Password [email or web address] 11/14/2011, 8:45 PM   LOS: 2 days

## 2011-11-15 DIAGNOSIS — R112 Nausea with vomiting, unspecified: Secondary | ICD-10-CM

## 2011-11-15 DIAGNOSIS — R072 Precordial pain: Secondary | ICD-10-CM

## 2011-11-15 DIAGNOSIS — I10 Essential (primary) hypertension: Secondary | ICD-10-CM

## 2011-11-15 LAB — CBC
HCT: 30 % — ABNORMAL LOW (ref 36.0–46.0)
Hemoglobin: 8.9 g/dL — ABNORMAL LOW (ref 12.0–15.0)
RBC: 4.49 MIL/uL (ref 3.87–5.11)
WBC: 7.7 10*3/uL (ref 4.0–10.5)

## 2011-11-15 LAB — BASIC METABOLIC PANEL
CO2: 23 mEq/L (ref 19–32)
Chloride: 103 mEq/L (ref 96–112)
GFR calc non Af Amer: 86 mL/min — ABNORMAL LOW (ref >90)
Glucose, Bld: 105 mg/dL — ABNORMAL HIGH (ref 70–99)
Potassium: 3.6 mEq/L (ref 3.5–5.1)
Sodium: 136 mEq/L (ref 135–145)

## 2011-11-15 MED ORDER — TRANEXAMIC ACID 650 MG PO TABS
1300.0000 mg | ORAL_TABLET | Freq: Three times a day (TID) | ORAL | Status: DC
  Filled 2011-11-15 (×4): qty 2

## 2011-11-15 MED ORDER — POLYETHYLENE GLYCOL 3350 17 G PO PACK
17.0000 g | PACK | Freq: Every day | ORAL | Status: DC | PRN
  Filled 2011-11-15: qty 1

## 2011-11-15 MED ORDER — FLEET ENEMA 7-19 GM/118ML RE ENEM
1.0000 | ENEMA | RECTAL | Status: DC | PRN
  Administered 2011-11-16: 1 via RECTAL
  Filled 2011-11-15: qty 1

## 2011-11-15 MED ORDER — BISACODYL 5 MG PO TBEC
5.0000 mg | DELAYED_RELEASE_TABLET | Freq: Every day | ORAL | Status: DC | PRN
  Administered 2011-11-15: 5 mg via ORAL
  Filled 2011-11-15: qty 1

## 2011-11-15 MED ORDER — AMLODIPINE BESYLATE 10 MG PO TABS
10.0000 mg | ORAL_TABLET | Freq: Every day | ORAL | Status: DC
  Administered 2011-11-15 – 2011-11-16 (×2): 10 mg via ORAL
  Filled 2011-11-15 (×2): qty 1

## 2011-11-15 NOTE — Progress Notes (Signed)
Patient ID: [name] [name], female   DOB: 04-02-1966, 46 y.o.   MRN: [id number]  [facility] PROGRESS NOTE  [name] [name] GEX:[id number] DOB: Oct 19, 1965 DOA: 11/12/2011 PCP: No primary provider on file.  Brief narrative: Pt is 46 yo female who presented to [facility] ED 11/12/2011 with main concern of sudden onset, substernal chest pain, that initially started the morning of admission and has been persistent. Pt described pain as dull, occasionally but not consistently radiating to the back are and associated with palpitations and shortness of breath. To date, ACS has been ruled out but acute blood loss anemia secondary to bleeding fibroid emerged as primary concern. I have called GYN clinic at [facility] hospital today and they will see pt for further evaluation. Pt has history of fibroid uterus and has required blood transfusion in the past. She was supposed to follow up but explained she never did due to financial burden.  Consultants:  Called GYN clinic [phone] today and they will see pt tomorrow  Procedures:  none  Antibiotics:  none  HPI/Subjective: Pt denies chest pain or abdominal pain.  Objective: Filed Vitals:   11/14/11 1319 11/14/11 2200 11/15/11 0356 11/15/11 1441  BP: 152/100 174/84 139/77 161/107  Pulse: 63 65 72 71  Temp: 98.2 F (36.8 C) 98.4 F (36.9 C) 97.5 F (36.4 C) 98.1 F (36.7 C)  TempSrc: Oral Oral Oral Oral  Resp: 18 16 18 18   Height:      Weight:      SpO2: 98% 98% 96% 100%    Intake/Output Summary (Last 24 hours) at 11/15/11 1917 Last data filed at 11/15/11 1300  Gross per 24 hour  Intake 1528.75 ml  Output      0 ml  Net 1528.75 ml    Exam:   General:  Pt is alert, follows commands appropriately, not in acute distress  Cardiovascular: Regular rate and rhythm, S1/S2, no murmurs, no rubs, no gallops  Respiratory: Clear to auscultation bilaterally, no wheezing, no crackles, no rhonchi  Abdomen: Soft, non tender, non distended, bowel  sounds present, no guarding  Extremities: No edema, pulses DP and PT palpable bilaterally  Neuro: Grossly nonfocal  Data Reviewed: Basic Metabolic Panel:  Lab 11/15/11 3664 11/14/11 0157 11/13/11 0700 11/12/11 1430 11/12/11 1426  NA 136 136 136 -- 141  K 3.6 2.9* 3.5 -- 3.5  CL 103 101 102 -- 105  CO2 23 23 24  -- 21  GLUCOSE 105* 102* 110* -- 95  BUN 12 7 8  -- 11  CREATININE 0.81 0.79 0.72 -- 0.79  CALCIUM 8.3* 8.6 8.7 -- 9.2  MG 2.2 -- -- 2.2 --  PHOS -- 3.2 -- 1.5* --   Liver Function Tests:  Lab 11/12/11 1426  AST 22  ALT 14  ALKPHOS 87  BILITOT 0.6  PROT 8.3  ALBUMIN 4.0   CBC:  Lab 11/15/11 0420 11/14/11 0157 11/13/11 0700 11/12/11 1426  WBC 7.7 6.0 5.9 4.4  NEUTROABS -- -- -- --  HGB 8.9* 9.5* 7.4* 8.2*  HCT 30.0* 32.3* 25.3* 29.1*  MCV 66.8* 66.6* 62.8* 62.4*  PLT 304 359 343 400   Cardiac Enzymes:  Lab 11/13/11 0700 11/12/11 2353 11/12/11 1430 11/12/11 1427  CKTOTAL 58 64 78 --  CKMB 1.6 1.7 1.2 --  CKMBINDEX -- -- -- --  TROPONINI <0.30 <0.30 <0.30 <0.30   CBG:  Lab 11/13/11 0819  GLUCAP 108*    Studies: Ct Abdomen Pelvis W Contrast  11/13/2011 *RADIOLOGY REPORT* Clinical Data: Abdominal pain, vomiting  CT ABDOMEN AND PELVIS WITH CONTRAST Technique: Multidetector CT imaging of the abdomen and pelvis was performed following the standard protocol during bolus administration of intravenous contrast. Contrast: OMNIPAQUE IOHEXOL 300 MG/ML SOLN Comparison: 11/28/2009 Findings: New minimal patchy probable atelectasis noted at the lung bases dependently. Cholecystectomy clips noted. Examination is degraded by patient motion. Adrenal glands, kidneys, spleen, and pancreas are normal. Small porta hepatis lymph nodes are noted, largest 0.7 cm image 23. No ascites or free air. Right lower quadrant surgical clip from presumed appendectomy noted. No bowel wall thickening. Fusiform mass-like enlargement of the anterior uterine fundus/body again noted, likely  previously seen fibroid versus focal adenomyosis. Ovaries are normal. No pelvic free fluid. The bladder is normal. No acute osseous finding. Lumbarization of S1 noted. IMPRESSION: No acute intra-abdominal or pelvic pathology. Original Report Authenticated By: Harrel Lemon, M.D.    Scheduled Meds:   . ferrous sulfate  325 mg Oral BID WC  . lisinopril  40 mg Oral Daily  . sodium chloride  3 mL Intravenous Q12H  . DISCONTD: lisinopril  20 mg Oral Daily   Continuous Infusions:   . sodium chloride 75 mL/hr at 11/14/11 0547     Assessment/Plan:  Chest Pain, musculoskeletal in etiology  - unclear etiology, likely musculoskeletal - nowresolved - please note that CE's x 3 are all within normal limits, no events on telemetry  - ACS ruled out  - continue supportive care for now   HTN, uncontrolled  - we started Lisinopril and increased the dose to 40 mg PO QD - add Norvasc   Nausea and Vomiting  - unclear etiology  - CT abdomen and pelvis negative for acute findings but fibroid uterus noted - this is now resolved - will continue supportive care   Anemia of chronic disease  - s/p 2 U PRBC transfusion  - anemia panel showing iron deficiency anemia - please note pt has menstrual period at this time and Hg is labile - after the 2 units of PRBC Hg increased appropriately form 7.4 --> 9.5 but this morning Hg dropped again - pt explained this happens recurrently and she has required transfusions in the past - I have spoke with GYN on call and the recommendation was to start pt on Lysteda and to call GYN clinic so that we can get pt into outpatient clinic  - GYN consult obtained - CBC in AM  - will start Lysteda   Code Status: Full Family Communication: Pt and mother at bedside Disposition Plan: Home when medically stable  Debbora Presto, MD  [facility] Pager [phone]  If 7PM-7AM, please contact night-coverage [email or web address] Password [email or web address] 11/15/2011,  7:17 PM   LOS: 3 days

## 2011-11-16 ENCOUNTER — Encounter (HOSPITAL_COMMUNITY): Payer: Self-pay | Admitting: Family Medicine

## 2011-11-16 ENCOUNTER — Other Ambulatory Visit: Payer: Self-pay | Admitting: *Deleted

## 2011-11-16 DIAGNOSIS — R079 Chest pain, unspecified: Secondary | ICD-10-CM

## 2011-11-16 DIAGNOSIS — R072 Precordial pain: Secondary | ICD-10-CM

## 2011-11-16 DIAGNOSIS — R112 Nausea with vomiting, unspecified: Secondary | ICD-10-CM

## 2011-11-16 DIAGNOSIS — D259 Leiomyoma of uterus, unspecified: Secondary | ICD-10-CM

## 2011-11-16 DIAGNOSIS — I10 Essential (primary) hypertension: Secondary | ICD-10-CM | POA: Diagnosis present

## 2011-11-16 DIAGNOSIS — N921 Excessive and frequent menstruation with irregular cycle: Secondary | ICD-10-CM | POA: Diagnosis present

## 2011-11-16 DIAGNOSIS — D62 Acute posthemorrhagic anemia: Secondary | ICD-10-CM | POA: Diagnosis present

## 2011-11-16 LAB — CBC
HCT: 30.5 % — ABNORMAL LOW (ref 36.0–46.0)
Hemoglobin: 9 g/dL — ABNORMAL LOW (ref 12.0–15.0)
MCV: 66.7 fL — ABNORMAL LOW (ref 78.0–100.0)
Platelets: 340 10*3/uL (ref 150–400)
RBC: 4.57 MIL/uL (ref 3.87–5.11)
WBC: 9.4 10*3/uL (ref 4.0–10.5)

## 2011-11-16 MED ORDER — MEDROXYPROGESTERONE ACETATE 10 MG PO TABS: 10.0000 mg | ORAL_TABLET | Freq: Every day | ORAL | Status: DC

## 2011-11-16 MED ORDER — AMLODIPINE BESYLATE 10 MG PO TABS: 10.0000 mg | ORAL_TABLET | Freq: Every day | ORAL | Status: DC

## 2011-11-16 MED ORDER — FERROUS SULFATE 325 (65 FE) MG PO TABS: 325.0000 mg | ORAL_TABLET | Freq: Two times a day (BID) | ORAL | Status: DC

## 2011-11-16 MED ORDER — MEDROXYPROGESTERONE ACETATE 10 MG PO TABS
10.0000 mg | ORAL_TABLET | Freq: Every day | ORAL | Status: DC
  Administered 2011-11-16: 10 mg via ORAL
  Filled 2011-11-16: qty 1

## 2011-11-16 MED ORDER — ATENOLOL 25 MG PO TABS: 25.0000 mg | ORAL_TABLET | Freq: Every day | ORAL | Status: DC

## 2011-11-16 NOTE — Discharge Summary (Signed)
DISCHARGE SUMMARY  [name] [name]  MR#: [id number]  DOB:1966/02/10  Date of Admission: 11/12/2011 Date of Discharge: 11/16/2011  Attending Physician:Emmalyn Hinson  Patient's PCP:No primary provider on file.  Consults:Treatment Team:  Reva Bores, MD  Discharge Diagnoses: Present on Admission:  .Leiomyoma of uterus, unspecified .Metrorrhagia .Acute blood loss anemia .Hypertension  Hospital Course: [name] [name] was admitted with chest pain and found to have anemia thought to be due to heavy menses related to fibroid uterus. She required prbc transfusion. She was seen by Dr Shawnie Pons who helped with the management. She was discharged on provera and iron supplements. Patient was placed on norvasc and atenolol prior to d/c for her uncontrolled htn She should follow with obgyn as scheduled. She is discharged in stable condition.   Medication List  As of 11/16/2011  6:15 PM   TAKE these medications         amLODipine 10 MG tablet   Commonly known as: NORVASC   Take 1 tablet (10 mg total) by mouth daily.      atenolol 25 MG tablet   Commonly known as: TENORMIN   Take 1 tablet (25 mg total) by mouth daily.      ferrous sulfate 325 (65 FE) MG tablet   Take 1 tablet (325 mg total) by mouth 2 (two) times daily.      medroxyPROGESTERone 10 MG tablet   Commonly known as: PROVERA   Take 1 tablet (10 mg total) by mouth daily.             Day of Discharge BP 115/79  Pulse 107  Temp 97.6 F (36.4 C) (Oral)  Resp 18  Ht 5\' 7"  (1.702 m)  Wt 105.28 kg (232 lb 1.6 oz)  BMI 36.35 kg/m2  SpO2 98%  LMP 11/05/2011  Physical Exam: Pale, otherwise normal.  Results for orders placed during the hospital encounter of 11/12/11 (from the past 24 hour(s))  CBC     Status: Abnormal   Collection Time   11/16/11  4:22 AM      Component Value Range   WBC 9.4  4.0 - 10.5 K/uL   RBC 4.57  3.87 - 5.11 MIL/uL   Hemoglobin 9.0 (*) 12.0 - 15.0 g/dL   HCT 78.2 (*) 95.6 - 21.3 %   MCV 66.7 (*)  78.0 - 100.0 fL   MCH 19.7 (*) 26.0 - 34.0 pg   MCHC 29.5 (*) 30.0 - 36.0 g/dL   RDW 08.6 (*) 57.8 - 46.9 %   Platelets 340  150 - 400 K/uL    Disposition: home.   Follow-up Appts: Discharge Orders    Future Appointments: Provider: Department: Dept Phone: Center:   11/28/2011 9:00 AM [facility] [phone]   11/30/2011 1:15 PM Myra Inda Coke, MD [facility] [phone] [facility]     Future Orders Please Complete By Expires   Diet - low sodium heart healthy      Increase activity slowly         Follow-up Information    Follow up with [facility] in 2 weeks.   Contact information:   [address] [place], [place]  [address] [phone]         Tests Needing Follow-up: Cbc.  Time spent in discharge (includes decision making & examination of pt): 15 minutes  Signed: Ronda Kazmi 11/16/2011, 6:15 PM

## 2011-11-16 NOTE — Progress Notes (Signed)
   CARE MANAGEMENT NOTE 11/16/2011  Patient:  [name],[name]   Account Number:  [id number]  Date Initiated:  11/16/2011  Documentation initiated by:  Jiles Crocker  Subjective/Objective Assessment:   ADMITTED WITH CHEST PAIN, ANEMIA     Action/Plan:   INDEPENDENT PRIOR TO ADMISSION   Anticipated DC Date:  11/18/2011   Anticipated DC Plan:  HOME/SELF CARE  In-house referral  Financial Counselor      DC Planning Services  CM consult            Status of service:  In process, will continue to follow Medicare Important Message given?  NA - LOS <3 / Initial given by admissions (If response is "NO", the following Medicare IM given date fields will be blank)  Per UR Regulation:  Reviewed for med. necessity/level of care/duration of stay  Comments:  11/16/2011- B Charniece Venturino RN, BSN, MHA

## 2011-11-16 NOTE — Consult Note (Signed)
[name] [name] is an 46 y.o. G2P2 Unknown female.   Reason for Consult:  Bleeding, fibroid uterus with menorrhagia requiring transfusion  HPI: 2 year h/o of worsening cycles and heavy bleeding.  Cycles come q 2 wks and last for 10-11 days.  She has been admitted and required transfusion x 3.  Past Medical History  Diagnosis Date  . Hypertension   . Postpartum cardiomyopathy 1990    resolved    Past Surgical History  Procedure Date  . Laparoscopic cholecystectomy   . Tubal ligation    P GYN Hx:  No h/o abnl pap, last pap little over one year ago and WNL per pt.  Family History  Problem Relation Age of Onset  . Diabetes Mother   . Hypertension Mother   Colon Cancer  Social History:  reports that she has never smoked. She does not have any smokeless tobacco history on file. She reports that she does not drink alcohol or use illicit drugs.  Allergies:  Allergies  Allergen Reactions  . Codeine Swelling    Current facility-administered medications:amLODipine (NORVASC) tablet 10 mg, 10 mg, Oral, Daily, Dorothea Ogle, MD, 10 mg at 11/16/11 0913;  aspirin-acetaminophen-caffeine (EXCEDRIN MIGRAINE) per tablet 1 tablet, 1 tablet, Oral, Q8H PRN, Dorothea Ogle, MD, 1 tablet at 11/14/11 1610;  bisacodyl (DULCOLAX) EC tablet 5 mg, 5 mg, Oral, Daily PRN, Caroline More, NP, 5 mg at 11/15/11 2149 ferrous sulfate tablet 325 mg, 325 mg, Oral, BID WC, Dorothea Ogle, MD, 325 mg at 11/16/11 0913;  ketorolac (TORADOL) 30 MG/ML injection 30 mg, 30 mg, Intravenous, Q6H PRN, Dorothea Ogle, MD, 30 mg at 11/15/11 1847;  lisinopril (PRINIVIL,ZESTRIL) tablet 40 mg, 40 mg, Oral, Daily, Dorothea Ogle, MD, 40 mg at 11/16/11 0913;  morphine 2 MG/ML injection 1 mg, 1 mg, Intravenous, Q4H PRN, Dorothea Ogle, MD, 1 mg at 11/15/11 1450 ondansetron (ZOFRAN) injection 4 mg, 4 mg, Intravenous, Q6H PRN, Rolan Lipa, NP, 4 mg at 11/13/11 1601;  polyethylene glycol (MIRALAX / GLYCOLAX) packet 17 g, 17 g, Oral,  Daily PRN, Caroline More, NP;  promethazine (PHENERGAN) injection 12.5 mg, 12.5 mg, Intravenous, Q6H PRN, Dorothea Ogle, MD, 12.5 mg at 11/15/11 1455;  sodium chloride 0.9 % injection 3 mL, 3 mL, Intravenous, Q12H, Dorothea Ogle, MD, 3 mL at 11/16/11 0913 sodium phosphate (FLEET) 7-19 GM/118ML enema 1 enema, 1 enema, Rectal, Q3 days PRN, Caroline More, NP;  DISCONTD: 0.9 %  sodium chloride infusion, , Intravenous, Continuous, Dorothea Ogle, MD, Last Rate: 75 mL/hr at 11/14/11 0547;  DISCONTD: tranexamic acid (LYSTEDA) tablet 1,300 mg, 1,300 mg, Oral, TID, Dorothea Ogle, MD    A comprehensive review of systems was negative except for: Neurological: positive for headaches  Blood pressure 129/86, pulse 98, temperature 98.2 F (36.8 C), temperature source Oral, resp. rate 16, height 5\' 7"  (1.702 m), weight 105.28 kg (232 lb 1.6 oz), last menstrual period 11/05/2011, SpO2 95.00%. BP 129/86  Pulse 98  Temp 98.2 F (36.8 C) (Oral)  Resp 16  Ht 5\' 7"  (1.702 m)  Wt 105.28 kg (232 lb 1.6 oz)  BMI 36.35 kg/m2  SpO2 95%  LMP 11/05/2011 General appearance: alert, cooperative, appears stated age and moderately obese Neck: supple, symmetrical, trachea midline and thyroid not enlarged, symmetric, no tenderness/mass/nodules Lungs: clear to auscultation bilaterally Heart: regular rate and rhythm, S1, S2 normal, no murmur, click, rub or gallop Abdomen: abnormal findings:  mass, located in the  lower abdomen Extremities: extremities normal, atraumatic, no cyanosis or edema Skin: Skin color, texture, turgor normal. No rashes or lesions Neurologic: Grossly normal   Lab Results  Component Value Date   WBC 9.4 11/16/2011   HGB 9.0* 11/16/2011   HCT 30.5* 11/16/2011   MCV 66.7* 11/16/2011   PLT 340 11/16/2011   Lab Results  Component Value Date   PREGTESTUR NEGATIVE 11/12/2011     Assessment/Plan Patient Active Problem List  Diagnosis  . Leiomyoma of uterus, unspecified  . Metrorrhagia  . Acute blood  loss anemia  . Hypertension  . Morbid obesity   Needs provera to stop bleeding.  Needs outpt. Workup to include pelvic sonography, pap, endometrial biopsy.  Schedule for hysterectomy.  Have provided.  Thanks for the consult.  Alyzah Pelly S 11/16/2011, 10:03 AM

## 2011-11-16 NOTE — Discharge Instructions (Signed)
You have an appointment in our office at [facility] on 11/30/2011 at 1:15.  Please arrive by 1:00.  They will mail you a financial aid packet.  Please fill this out and bring it with you to your appointment.  Please take your provera as directed to keep your bleeding minimized.  Please take your iron to help with building up your blood counts.  Menorrhagia Dysfunctional uterine bleeding is different from a normal menstrual period. When periods are heavy or there is more bleeding than is usual for you, it is called menorrhagia. It may be caused by hormonal imbalance, or physical, metabolic, or other problems. Examination is necessary in order that your caregiver may treat treatable causes. If this is a continuing problem, a D&C may be needed. That means that the cervix (the opening of the uterus or womb) is dilated (stretched larger) and the lining of the uterus is scraped out. The tissue scraped out is then examined under a microscope by a specialist (pathologist) to make sure there is nothing of concern that needs further or more extensive treatment. HOME CARE INSTRUCTIONS   If medications were prescribed, take exactly as directed. Do not change or switch medications without consulting your caregiver.   Long term heavy bleeding may result in iron deficiency. Your caregiver may have prescribed iron pills. They help replace the iron your body lost from heavy bleeding. Take exactly as directed. Iron may cause constipation. If this becomes a problem, increase the bran, fruits, and roughage in your diet.   Do not take aspirin or medicines that contain aspirin one week before or during your menstrual period. Aspirin may make the bleeding worse.   If you need to change your sanitary pad or tampon more than once every 2 hours, stay in bed and rest as much as possible until the bleeding stops.   Eat well-balanced meals. Eat foods high in iron. Examples are leafy green vegetables, meat, liver,  eggs, and whole grain breads and cereals. Do not try to lose weight until the abnormal bleeding has stopped and your blood iron level is back to normal.  SEEK MEDICAL CARE IF:   You need to change your sanitary pad or tampon more than once an hour.   You develop nausea (feeling sick to your stomach) and vomiting, dizziness, or diarrhea while you are taking your medicine.   You have any problems that may be related to the medicine you are taking.  SEEK IMMEDIATE MEDICAL CARE IF:   You have a fever.   You develop chills.   You develop severe bleeding or start to pass blood clots.   You feel dizzy or faint.  MAKE SURE YOU:   Understand these instructions.   Will watch your condition.   Will get help right away if you are not doing well or get worse.  Document Released: 05/16/2005 Document Revised: 05/05/2011 Document Reviewed: 01/04/2008 [facility] Patient Information 2012 [place], [place].

## 2011-11-28 ENCOUNTER — Ambulatory Visit (HOSPITAL_COMMUNITY)
Admit: 2011-11-28 | Discharge: 2011-11-28 | Disposition: A | Discharge status: Home / Self Care | Payer: Self-pay | Attending: Family Medicine | Admitting: Family Medicine

## 2011-11-28 DIAGNOSIS — D259 Leiomyoma of uterus, unspecified: Secondary | ICD-10-CM | POA: Insufficient documentation

## 2011-11-30 ENCOUNTER — Ambulatory Visit (INDEPENDENT_AMBULATORY_CARE_PROVIDER_SITE_OTHER): Payer: Self-pay | Admitting: Obstetrics & Gynecology

## 2011-11-30 ENCOUNTER — Encounter: Payer: Self-pay | Admitting: Obstetrics & Gynecology

## 2011-11-30 ENCOUNTER — Other Ambulatory Visit (HOSPITAL_COMMUNITY)
Admission: RE | Admit: 2011-11-30 | Discharge: 2011-11-30 | Disposition: A | Discharge status: Home / Self Care | Payer: Self-pay | Source: Ambulatory Visit | Attending: Obstetrics & Gynecology | Admitting: Obstetrics & Gynecology

## 2011-11-30 VITALS — BP 135/84 | HR 73 | Temp 98.1°F | Ht 67.25 in | Wt 233.0 lb

## 2011-11-30 DIAGNOSIS — Z0000 Encounter for general adult medical examination without abnormal findings: Secondary | ICD-10-CM

## 2011-11-30 DIAGNOSIS — D259 Leiomyoma of uterus, unspecified: Secondary | ICD-10-CM | POA: Insufficient documentation

## 2011-11-30 DIAGNOSIS — N920 Excessive and frequent menstruation with regular cycle: Secondary | ICD-10-CM | POA: Insufficient documentation

## 2011-11-30 DIAGNOSIS — Z01419 Encounter for gynecological examination (general) (routine) without abnormal findings: Secondary | ICD-10-CM

## 2011-11-30 DIAGNOSIS — D219 Benign neoplasm of connective and other soft tissue, unspecified: Secondary | ICD-10-CM

## 2011-11-30 DIAGNOSIS — Z01812 Encounter for preprocedural laboratory examination: Secondary | ICD-10-CM

## 2011-11-30 LAB — POCT PREGNANCY, URINE: Preg Test, Ur: NEGATIVE

## 2011-11-30 MED ORDER — MEDROXYPROGESTERONE ACETATE 150 MG/ML IM SUSP: 150.0000 mg | INTRAMUSCULAR | Status: DC

## 2011-11-30 MED ORDER — MEDROXYPROGESTERONE ACETATE 150 MG/ML IM SUSP
150.0000 mg | INTRAMUSCULAR | Status: DC
  Administered 2011-11-30: 150 mg via INTRAMUSCULAR

## 2011-11-30 NOTE — Progress Notes (Signed)
  Subjective:    Patient ID: [name] [name], female    DOB: 08-20-1965, 46 y.o.   MRN: [id number]  HPI  46 yo M AA G2 P2  Who is here because of such heavy periods that she required a hospital admission with blood transfusion last month. She was started on po provera but is still having a moderate amount of bleeding.  Review of Systems Pap and mammogram due U/s findings noted    Objective:   Physical Exam Heart- rrr Lungs- CTAB Abd- obese, benign Cervix- parous  UPT negative, consent signed, time out done Cervix prepped with betadine, uterus sounded to 15 cm Pipelle used for 3 passes, Patient tolerated procedure well        Assessment & Plan:  Annual- pap and mammogram Symptomatic fibroids- EMBX today. RTC for surgical consult.

## 2011-11-30 NOTE — Addendum Note (Signed)
Addended by: Toula Moos on: 11/30/2011 03:06 PM   Modules accepted: Orders

## 2011-12-21 ENCOUNTER — Ambulatory Visit (INDEPENDENT_AMBULATORY_CARE_PROVIDER_SITE_OTHER): Payer: Self-pay | Admitting: Obstetrics & Gynecology

## 2011-12-21 ENCOUNTER — Encounter: Payer: Self-pay | Admitting: Obstetrics & Gynecology

## 2011-12-21 VITALS — BP 145/93 | HR 77 | Temp 100.1°F | Ht 67.25 in | Wt 233.8 lb

## 2011-12-21 DIAGNOSIS — D259 Leiomyoma of uterus, unspecified: Secondary | ICD-10-CM

## 2011-12-21 DIAGNOSIS — N921 Excessive and frequent menstruation with irregular cycle: Secondary | ICD-10-CM

## 2011-12-21 MED ORDER — LEUPROLIDE ACETATE (3 MONTH) 11.25 MG IM KIT: 11.2500 mg | PACK | Freq: Once | INTRAMUSCULAR | Status: DC

## 2011-12-21 NOTE — Patient Instructions (Signed)
Hysterectomy Information  A hysterectomy is a procedure where your uterus is surgically removed. It will no longer be possible to have menstrual periods or to become pregnant. The tubes and ovaries can be removed (bilateral salpingo-oopherectomy) during this surgery as well.  REASONS FOR A HYSTERECTOMY  Persistent, abnormal bleeding.   Lasting (chronic) pelvic pain or infection.   The lining of the uterus (endometrium) starts growing outside the uterus (endometriosis).   The endometrium starts growing in the muscle of the uterus (adenomyosis).   The uterus falls down into the vagina (pelvic organ prolapse).   Symptomatic uterine fibroids.   Precancerous cells.   Cervical cancer or uterine cancer.  TYPES OF HYSTERECTOMIES  Supracervical hysterectomy. This type removes the top part of the uterus, but not the cervix.   Total hysterectomy. This type removes the uterus and cervix.   Radical hysterectomy. This type removes the uterus, cervix, and the fibrous tissue that holds the uterus in place in the pelvis (parametrium).  WAYS A HYSTERECTOMY CAN BE PERFORMED  Abdominal hysterectomy. A large surgical cut (incision) is made in the abdomen. The uterus is removed through this incision.   Vaginal hysterectomy. An incision is made in the vagina. The uterus is removed through this incision. There are no abdominal incisions.   Conventional laparoscopic hysterectomy. A thin, lighted tube with a camera (laparoscope) is inserted into 3 or 4 small incisions in the abdomen. The uterus is cut into small pieces. The small pieces are removed through the incisions, or they are removed through the vagina.   Laparoscopic assisted vaginal hysterectomy (LAVH). Three or four small incisions are made in the abdomen. Part of the surgery is performed laparoscopically and part vaginally. The uterus is removed through the vagina.   Robot-assisted laparoscopic hysterectomy. A laparoscope is inserted into 3 or 4  small incisions in the abdomen. A computer-controlled device is used to give the surgeon a 3D image. This allows for more precise movements of surgical instruments. The uterus is cut into small pieces and removed through the incisions or removed through the vagina.  RISKS OF HYSTERECTOMY   Bleeding and risk of blood transfusion. Tell your caregiver if you do not want to receive any blood products.   Blood clots in the legs or lung.   Infection.   Injury to surrounding organs.   Anesthesia problems or side effects.   Conversion to an abdominal hysterectomy.  WHAT TO EXPECT AFTER A HYSTERECTOMY  You will be given pain medicine.   You will need to have someone with you for the first 3 to 5 days after you go home.   You will need to follow up with your surgeon in 2 to 4 weeks after surgery to evaluate your progress.   You may have early menopause symptoms like hot flashes, night sweats, and insomnia.   If you had a hysterectomy for a problem that was not a cancer or a condition that could lead to cancer, then you no longer need Pap tests. However, even if you no longer need a Pap test, a regular exam is a good idea to make sure no other problems are starting.  Document Released: 11/09/2000 Document Revised: 05/05/2011 Document Reviewed: 12/25/2010 [employer] Patient Information 2012 [employer]. 

## 2011-12-21 NOTE — Progress Notes (Signed)
Patient ID: [name] [name], female   DOB: [birth year], 46 y.o.   MRN: [id number]  No chief complaint on file.   HPI [name] [name] is a 46 y.o. female.  History of heavy menses and anemia requiring transfusion. Symptomatic fibroid uterus. Endometrial biopsy was done 11/30/11. HPI  Past Medical History  Diagnosis Date  . Hypertension   . Postpartum cardiomyopathy 1990    resolved    Past Surgical History  Procedure Date  . Laparoscopic cholecystectomy   . Tubal ligation     Family History  Problem Relation Age of Onset  . Diabetes Mother   . Hypertension Mother     Social History History  Substance Use Topics  . Smoking status: Never Smoker   . Smokeless tobacco: Not on file  . Alcohol Use: No    Allergies  Allergen Reactions  . Codeine Swelling  . Morphine And Related Hives and Nausea And Vomiting     sweating    Current Outpatient Prescriptions  Medication Sig Dispense Refill  . amLODipine (NORVASC) 10 MG tablet Take 1 tablet (10 mg total) by mouth daily.  30 tablet  1  . atenolol (TENORMIN) 25 MG tablet Take 1 tablet (25 mg total) by mouth daily.  30 tablet  1  . ferrous sulfate (FERROUSUL) 325 (65 FE) MG tablet Take 1 tablet (325 mg total) by mouth 2 (two) times daily.  60 tablet  1  . medroxyPROGESTERone (DEPO-PROVERA) 150 MG/ML injection Inject 1 mL (150 mg total) into the muscle every 3 (three) months.  1 mL  0   Current Facility-Administered Medications  Medication Dose Route Frequency Provider Last Rate Last Dose  . medroxyPROGESTERone (DEPO-PROVERA) injection 150 mg  150 mg Intramuscular Q90 days Allie Bossier, MD   150 mg at 11/30/11 1502    Review of Systems Review of Systems  Constitutional: Negative for fever and activity change.  Gastrointestinal: Positive for abdominal pain and constipation. Negative for nausea.  Genitourinary: Positive for vaginal bleeding (clots, cramps) and menstrual problem. Negative for dysuria.    Last menstrual period  11/14/2011.  Physical Exam Physical Exam  Constitutional: She appears well-developed. No distress (obese).  Pulmonary/Chest: Effort normal.  Abdominal: Soft. She exhibits mass. There is no tenderness.  Genitourinary: Vagina normal.       Vaginal bleeding, uterus globular, 14-16 weeks size  Neurological: She is alert.  Skin: Skin is warm and dry.  Psychiatric: She has a normal mood and affect.    Data Reviewed  *RADIOLOGY REPORT*  Clinical Data: Leiomyoma of the uterus. LMP 11/14/2011  TRANSABDOMINAL ULTRASOUND OF PELVIS  Technique: Transabdominal ultrasound examination of the pelvis was  performed including evaluation of the uterus, ovaries, adnexal  regions, and pelvic cul-de-sac.  Comparison: Pelvic ultrasound 01/02/2011 and 12/07/2009  Findings:  Uterus: The uterus is enlarged, globular in shape, and anteverted.  It measures 15 x 10.6 x 11.5 cm. The myometrium is heterogeneous,  with linear areas of shadowing. Again seen is an ill-defined area  of heterogeneous echotexture in the anterior uterine body, with a  submucosal component that measures approximately 12.2 x 6.5 x 10.6  cm. This could reflect a focal adenomyoma or a submucosal fibroid  (previously measured 10 x 5 x 6.8 cm in August 2012). There is  suggestion of displacement of /mass effect upon the endometrium by  this anterior uterine body process. The junctional zone between the  myometrium and endometrium is not well defined.  Endometrium: The endometrium is not well visualized, likely  due to  the anterior uterine body adenomyoma versus fibroid.  Right ovary: Best visualized transabdominally. Measures 2.8 x 2.4  x 4.2 cm and appears within normal limits. No adnexal mass.  Left ovary: Not visualized, due to bowel gas in the left adnexa.  Other Findings: No free fluid is seen.  IMPRESSION:  1.Enlarged globular uterus contains an ill-defined heterogeneous  mass-like area in the anterior uterine body, with a  submucosal  component. This is either a focal adenomyoma (favored) or a  uterine fibroid and may have enlarged slightly since the prior  study. Pelvic MRI could be performed to distinguish between these  two entities, if desired.  2. Due to the abnormality in the anterior uterus, the endometrium  is not well visualized. Endometrial pathology cannot be excluded.  3. Normal appearance of the right ovary. Nonvisualization of the  left ovary.  Original Report Authenticated By: Britta Mccreedy, M.D.      Atrophic endometrium, normal pap 11/30/11 CBC    Component Value Date/Time   WBC 9.4 11/16/2011 0422   RBC 4.57 11/16/2011 0422   HGB 9.0* 11/16/2011 0422   HCT 30.5* 11/16/2011 0422   PLT 340 11/16/2011 0422   MCV 66.7* 11/16/2011 0422   MCH 19.7* 11/16/2011 0422   MCHC 29.5* 11/16/2011 0422   RDW 23.4* 11/16/2011 0422   LYMPHSABS 1.2 01/02/2011 1520   MONOABS 0.5 01/02/2011 1520   EOSABS 0.1 01/02/2011 1520   BASOSABS 0.0 01/02/2011 1520     Assessment    Fibroid uterus, menorrhagia, anemia    Plan    TAH/bialteral salpingectomy. Oophorectomy discussed. Procedure and risks of anesthesia, bleeding, transfusion, visceral organ damage and pain were discussed and her questions were answered. Ordered Lupron Depot 11.25, schedule surgery 8 weeks       [name] [name] 12/21/2011, 1:05 PM

## 2011-12-23 ENCOUNTER — Other Ambulatory Visit: Payer: Self-pay | Admitting: Obstetrics & Gynecology

## 2012-02-02 ENCOUNTER — Encounter (HOSPITAL_COMMUNITY): Payer: Self-pay | Admitting: Pharmacist

## 2012-02-06 ENCOUNTER — Telehealth: Payer: Self-pay | Admitting: Obstetrics and Gynecology

## 2012-02-06 NOTE — Telephone Encounter (Signed)
Patient called requesting if the clinic can giver her RF for Atenelol and Norvasc; originally prescribed to her from ER visit in [place] early this May. Per Dr. Jolayne Panther, patient needs to get a PCP to manage her hypertension. Patient states she does not have PCP because of no money and no insurance. Gave patient information and phone numbers to call [facility]. Patient agrees to call.

## 2012-02-14 ENCOUNTER — Encounter (HOSPITAL_COMMUNITY): Payer: Self-pay

## 2012-02-14 ENCOUNTER — Encounter (HOSPITAL_COMMUNITY)
Admission: RE | Admit: 2012-02-14 | Discharge: 2012-02-14 | Disposition: A | Discharge status: Home / Self Care | Payer: Self-pay | Source: Ambulatory Visit | Attending: Obstetrics & Gynecology | Admitting: Obstetrics & Gynecology

## 2012-02-14 HISTORY — DX: Cardiac arrhythmia, unspecified: I49.9

## 2012-02-14 HISTORY — DX: Abnormal uterine and vaginal bleeding, unspecified: N93.9

## 2012-02-14 HISTORY — DX: Anemia, unspecified: D64.9

## 2012-02-14 HISTORY — DX: Headache: R51

## 2012-02-14 LAB — CBC
HCT: 34.4 % — ABNORMAL LOW (ref 36.0–46.0)
MCHC: 30.2 g/dL (ref 30.0–36.0)
RDW: 20.7 % — ABNORMAL HIGH (ref 11.5–15.5)

## 2012-02-14 LAB — BASIC METABOLIC PANEL
BUN: 11 mg/dL (ref 6–23)
Creatinine, Ser: 0.91 mg/dL (ref 0.50–1.10)
GFR calc Af Amer: 87 mL/min — ABNORMAL LOW (ref >90)
GFR calc non Af Amer: 75 mL/min — ABNORMAL LOW (ref >90)
Potassium: 3.7 mEq/L (ref 3.5–5.1)

## 2012-02-14 LAB — SURGICAL PCR SCREEN: Staphylococcus aureus: NEGATIVE

## 2012-02-14 NOTE — Patient Instructions (Addendum)
Your procedure is scheduled on:02/20/12  Enter through the Main Entrance at :1130 am Pick up desk phone and dial [phone] and inform us of your arrival.  Please call [phone] if you have any problems the morning of surgery.  Remember: Do not eat after midnight:Monday Do not drink after:9am on Monday  Take these meds the morning of surgery with a sip of water: BP meds  DO NOT wear jewelry, eye make-up, lipstick,body lotion, or dark fingernail polish. Do not shave for 48 hours prior to surgery.  If you are to be admitted after surgery, leave suitcase in car until your room has been assigned. Patients discharged on the day of surgery will not be allowed to drive home.   Remember to use your Hibiclens as instructed.

## 2012-02-20 ENCOUNTER — Inpatient Hospital Stay (HOSPITAL_COMMUNITY)
Admission: RE | Admit: 2012-02-20 | Discharge: 2012-02-22 | DRG: 743 | Disposition: A | Discharge status: Home / Self Care | Payer: MEDICAID | Source: Ambulatory Visit | Attending: Obstetrics & Gynecology | Admitting: Obstetrics & Gynecology

## 2012-02-20 ENCOUNTER — Encounter (HOSPITAL_COMMUNITY): Payer: Self-pay | Admitting: Anesthesiology

## 2012-02-20 ENCOUNTER — Inpatient Hospital Stay (HOSPITAL_COMMUNITY): Payer: Self-pay | Admitting: Anesthesiology

## 2012-02-20 ENCOUNTER — Encounter (HOSPITAL_COMMUNITY): Payer: Self-pay | Admitting: Obstetrics & Gynecology

## 2012-02-20 ENCOUNTER — Encounter (HOSPITAL_COMMUNITY)
Admission: RE | Disposition: A | Discharge status: Home / Self Care | Payer: Self-pay | Source: Ambulatory Visit | Attending: Obstetrics & Gynecology

## 2012-02-20 DIAGNOSIS — N921 Excessive and frequent menstruation with irregular cycle: Secondary | ICD-10-CM

## 2012-02-20 DIAGNOSIS — N8000 Endometriosis of the uterus, unspecified: Secondary | ICD-10-CM | POA: Diagnosis present

## 2012-02-20 DIAGNOSIS — D649 Anemia, unspecified: Secondary | ICD-10-CM | POA: Diagnosis present

## 2012-02-20 DIAGNOSIS — D259 Leiomyoma of uterus, unspecified: Secondary | ICD-10-CM

## 2012-02-20 DIAGNOSIS — N920 Excessive and frequent menstruation with regular cycle: Secondary | ICD-10-CM | POA: Diagnosis present

## 2012-02-20 DIAGNOSIS — N838 Other noninflammatory disorders of ovary, fallopian tube and broad ligament: Secondary | ICD-10-CM | POA: Diagnosis present

## 2012-02-20 DIAGNOSIS — I10 Essential (primary) hypertension: Secondary | ICD-10-CM | POA: Diagnosis present

## 2012-02-20 DIAGNOSIS — D62 Acute posthemorrhagic anemia: Secondary | ICD-10-CM

## 2012-02-20 DIAGNOSIS — D251 Intramural leiomyoma of uterus: Principal | ICD-10-CM | POA: Diagnosis present

## 2012-02-20 HISTORY — PX: ABDOMINAL HYSTERECTOMY: SHX81

## 2012-02-20 LAB — TYPE AND SCREEN
ABO/RH(D): B POS
Antibody Screen: NEGATIVE

## 2012-02-20 LAB — ABO/RH: ABO/RH(D): B POS

## 2012-02-20 SURGERY — HYSTERECTOMY, ABDOMINAL
Anesthesia: General | Site: Abdomen | Wound class: Clean Contaminated

## 2012-02-20 MED ORDER — ROCURONIUM BROMIDE 50 MG/5ML IV SOLN
INTRAVENOUS | Status: AC
  Filled 2012-02-20: qty 1

## 2012-02-20 MED ORDER — KETOROLAC TROMETHAMINE 60 MG/2ML IM SOLN
INTRAMUSCULAR | Status: AC
  Filled 2012-02-20: qty 2

## 2012-02-20 MED ORDER — FENTANYL CITRATE 0.05 MG/ML IJ SOLN
INTRAMUSCULAR | Status: AC
  Filled 2012-02-20: qty 2

## 2012-02-20 MED ORDER — LACTATED RINGERS IV SOLN: INTRAVENOUS | Status: DC

## 2012-02-20 MED ORDER — KETOROLAC TROMETHAMINE 30 MG/ML IJ SOLN: 30.0000 mg | Freq: Four times a day (QID) | INTRAMUSCULAR | Status: DC

## 2012-02-20 MED ORDER — LABETALOL HCL 5 MG/ML IV SOLN
20.0000 mg | Freq: Once | INTRAVENOUS | Status: AC
  Administered 2012-02-20: 20 mg via INTRAVENOUS
  Filled 2012-02-20: qty 4

## 2012-02-20 MED ORDER — PHENYLEPHRINE 40 MCG/ML (10ML) SYRINGE FOR IV PUSH (FOR BLOOD PRESSURE SUPPORT)
PREFILLED_SYRINGE | INTRAVENOUS | Status: AC
  Filled 2012-02-20: qty 5

## 2012-02-20 MED ORDER — ONDANSETRON HCL 4 MG/2ML IJ SOLN: 4.0000 mg | Freq: Four times a day (QID) | INTRAMUSCULAR | Status: DC | PRN

## 2012-02-20 MED ORDER — AMLODIPINE BESYLATE 10 MG PO TABS
10.0000 mg | ORAL_TABLET | Freq: Every day | ORAL | Status: DC
  Administered 2012-02-21 – 2012-02-22 (×2): 10 mg via ORAL
  Filled 2012-02-20 (×3): qty 1

## 2012-02-20 MED ORDER — DEXTROSE 5 % IV SOLN: 2.0000 g | INTRAVENOUS | Status: DC

## 2012-02-20 MED ORDER — PHENYLEPHRINE HCL 10 MG/ML IJ SOLN: INTRAMUSCULAR | Status: DC | PRN

## 2012-02-20 MED ORDER — SODIUM CHLORIDE 0.9 % IJ SOLN: 9.0000 mL | INTRAMUSCULAR | Status: DC | PRN

## 2012-02-20 MED ORDER — GLYCOPYRROLATE 0.2 MG/ML IJ SOLN
INTRAMUSCULAR | Status: DC | PRN
  Administered 2012-02-20: 0.6 mg via INTRAVENOUS

## 2012-02-20 MED ORDER — DIPHENHYDRAMINE HCL 50 MG/ML IJ SOLN: 12.5000 mg | Freq: Four times a day (QID) | INTRAMUSCULAR | Status: DC | PRN

## 2012-02-20 MED ORDER — FENTANYL CITRATE 0.05 MG/ML IJ SOLN
25.0000 ug | INTRAMUSCULAR | Status: DC | PRN
  Administered 2012-02-20: 50 ug via INTRAVENOUS

## 2012-02-20 MED ORDER — CEFAZOLIN SODIUM-DEXTROSE 2-3 GM-% IV SOLR
INTRAVENOUS | Status: AC
  Filled 2012-02-20: qty 50

## 2012-02-20 MED ORDER — KETOROLAC TROMETHAMINE 30 MG/ML IJ SOLN
INTRAMUSCULAR | Status: DC | PRN
  Administered 2012-02-20: 60 mg via INTRAVENOUS

## 2012-02-20 MED ORDER — NEOSTIGMINE METHYLSULFATE 1 MG/ML IJ SOLN
INTRAMUSCULAR | Status: AC
  Filled 2012-02-20: qty 10

## 2012-02-20 MED ORDER — PHENYLEPHRINE HCL 10 MG/ML IJ SOLN
INTRAMUSCULAR | Status: DC | PRN
  Administered 2012-02-20 (×4): 40 ug via INTRAVENOUS

## 2012-02-20 MED ORDER — DEXAMETHASONE SODIUM PHOSPHATE 10 MG/ML IJ SOLN
INTRAMUSCULAR | Status: AC
  Filled 2012-02-20: qty 1

## 2012-02-20 MED ORDER — ROCURONIUM BROMIDE 100 MG/10ML IV SOLN
INTRAVENOUS | Status: DC | PRN
  Administered 2012-02-20: 45 mg via INTRAVENOUS
  Administered 2012-02-20: 5 mg via INTRAVENOUS
  Administered 2012-02-20 (×2): 10 mg via INTRAVENOUS

## 2012-02-20 MED ORDER — TEMAZEPAM 15 MG PO CAPS
15.0000 mg | ORAL_CAPSULE | Freq: Every evening | ORAL | Status: DC | PRN
Start: 2012-02-20 — End: 2012-02-22

## 2012-02-20 MED ORDER — CEFAZOLIN SODIUM-DEXTROSE 2-3 GM-% IV SOLR
2.0000 g | Freq: Once | INTRAVENOUS | Status: AC
  Administered 2012-02-20: 2 g via INTRAVENOUS

## 2012-02-20 MED ORDER — LACTATED RINGERS IV SOLN
INTRAVENOUS | Status: DC
  Administered 2012-02-20 (×5): via INTRAVENOUS

## 2012-02-20 MED ORDER — NALOXONE HCL 0.4 MG/ML IJ SOLN: 0.4000 mg | INTRAMUSCULAR | Status: DC | PRN

## 2012-02-20 MED ORDER — PROPOFOL 10 MG/ML IV EMUL
INTRAVENOUS | Status: AC
  Filled 2012-02-20: qty 20

## 2012-02-20 MED ORDER — ONDANSETRON HCL 4 MG/2ML IJ SOLN
INTRAMUSCULAR | Status: AC
  Filled 2012-02-20: qty 2

## 2012-02-20 MED ORDER — GLYCOPYRROLATE 0.2 MG/ML IJ SOLN
INTRAMUSCULAR | Status: AC
  Filled 2012-02-20: qty 1

## 2012-02-20 MED ORDER — FENTANYL CITRATE 0.05 MG/ML IJ SOLN
INTRAMUSCULAR | Status: AC
  Filled 2012-02-20: qty 5

## 2012-02-20 MED ORDER — PROPOFOL 10 MG/ML IV EMUL
INTRAVENOUS | Status: DC | PRN
  Administered 2012-02-20: 180 mg via INTRAVENOUS
  Administered 2012-02-20: 20 mg via INTRAVENOUS

## 2012-02-20 MED ORDER — DEXAMETHASONE SODIUM PHOSPHATE 4 MG/ML IJ SOLN
INTRAMUSCULAR | Status: DC | PRN
  Administered 2012-02-20: 8 mg via INTRAVENOUS

## 2012-02-20 MED ORDER — BUPIVACAINE HCL (PF) 0.5 % IJ SOLN
INTRAMUSCULAR | Status: AC
  Filled 2012-02-20: qty 30

## 2012-02-20 MED ORDER — ONDANSETRON HCL 4 MG/2ML IJ SOLN
INTRAMUSCULAR | Status: DC | PRN
  Administered 2012-02-20: 4 mg via INTRAVENOUS

## 2012-02-20 MED ORDER — LACTATED RINGERS IV SOLN
INTRAVENOUS | Status: DC
  Administered 2012-02-21: 02:00:00 via INTRAVENOUS

## 2012-02-20 MED ORDER — ONDANSETRON HCL 4 MG PO TABS
4.0000 mg | ORAL_TABLET | Freq: Four times a day (QID) | ORAL | Status: DC | PRN
  Administered 2012-02-21 – 2012-02-22 (×3): 4 mg via ORAL
  Filled 2012-02-20 (×4): qty 1

## 2012-02-20 MED ORDER — FENTANYL 10 MCG/ML IV SOLN
INTRAVENOUS | Status: DC
  Administered 2012-02-20: 18:00:00 via INTRAVENOUS
  Administered 2012-02-20: 178 ug via INTRAVENOUS
  Administered 2012-02-21: 120 ug via INTRAVENOUS
  Administered 2012-02-21: 100 ug via INTRAVENOUS
  Filled 2012-02-20 (×2): qty 50

## 2012-02-20 MED ORDER — ATENOLOL 25 MG PO TABS
25.0000 mg | ORAL_TABLET | Freq: Every day | ORAL | Status: DC
  Administered 2012-02-21 – 2012-02-22 (×2): 25 mg via ORAL
  Filled 2012-02-20 (×3): qty 1

## 2012-02-20 MED ORDER — MIDAZOLAM HCL 2 MG/2ML IJ SOLN
INTRAMUSCULAR | Status: AC
  Filled 2012-02-20: qty 2

## 2012-02-20 MED ORDER — FENTANYL CITRATE 0.05 MG/ML IJ SOLN
INTRAMUSCULAR | Status: DC | PRN
  Administered 2012-02-20 (×7): 50 ug via INTRAVENOUS

## 2012-02-20 MED ORDER — NEOSTIGMINE METHYLSULFATE 1 MG/ML IJ SOLN
INTRAMUSCULAR | Status: DC | PRN
  Administered 2012-02-20: 3 mg via INTRAVENOUS

## 2012-02-20 MED ORDER — KETOROLAC TROMETHAMINE 30 MG/ML IJ SOLN
30.0000 mg | Freq: Four times a day (QID) | INTRAMUSCULAR | Status: DC
  Administered 2012-02-20 – 2012-02-21 (×3): 30 mg via INTRAVENOUS
  Filled 2012-02-20 (×3): qty 1

## 2012-02-20 MED ORDER — MIDAZOLAM HCL 5 MG/5ML IJ SOLN
INTRAMUSCULAR | Status: DC | PRN
  Administered 2012-02-20: 2 mg via INTRAVENOUS

## 2012-02-20 MED ORDER — LIDOCAINE HCL (CARDIAC) 20 MG/ML IV SOLN
INTRAVENOUS | Status: AC
  Filled 2012-02-20: qty 5

## 2012-02-20 MED ORDER — BUPIVACAINE HCL (PF) 0.5 % IJ SOLN
INTRAMUSCULAR | Status: DC | PRN
  Administered 2012-02-20: 20 mL

## 2012-02-20 MED ORDER — FENTANYL CITRATE 0.05 MG/ML IJ SOLN
INTRAMUSCULAR | Status: AC
  Administered 2012-02-20: 50 ug via INTRAVENOUS
  Filled 2012-02-20: qty 2

## 2012-02-20 MED ORDER — DIPHENHYDRAMINE HCL 12.5 MG/5ML PO ELIX: 12.5000 mg | ORAL_SOLUTION | Freq: Four times a day (QID) | ORAL | Status: DC | PRN

## 2012-02-20 MED ORDER — LIDOCAINE HCL (CARDIAC) 20 MG/ML IV SOLN
INTRAVENOUS | Status: DC | PRN
  Administered 2012-02-20: 80 mg via INTRAVENOUS

## 2012-02-20 SURGICAL SUPPLY — 35 items
CANISTER SUCTION 2500CC (MISCELLANEOUS) ×3 IMPLANT
CHLORAPREP W/TINT 26ML (MISCELLANEOUS) ×3 IMPLANT
CLOTH BEACON ORANGE TIMEOUT ST (SAFETY) ×3 IMPLANT
CONT PATH 165OZ SNAP LID (FORM) ×3 IMPLANT
DRSG COVADERM 4X10 (GAUZE/BANDAGES/DRESSINGS) ×3 IMPLANT
GAUZE SPONGE 4X4 16PLY XRAY LF (GAUZE/BANDAGES/DRESSINGS) ×3 IMPLANT
GLOVE BIO SURGEON STRL SZ 6.5 (GLOVE) ×3 IMPLANT
GLOVE BIO SURGEON STRL SZ7 (GLOVE) ×3 IMPLANT
GLOVE BIOGEL PI IND STRL 7.0 (GLOVE) ×6 IMPLANT
GLOVE BIOGEL PI IND STRL 7.5 (GLOVE) ×2 IMPLANT
GLOVE BIOGEL PI INDICATOR 7.0 (GLOVE) ×3
GLOVE BIOGEL PI INDICATOR 7.5 (GLOVE) ×1
GLOVE ECLIPSE 7.0 STRL STRAW (GLOVE) ×3 IMPLANT
GOWN PREVENTION PLUS LG XLONG (DISPOSABLE) ×3 IMPLANT
GOWN STRL REIN XL XLG (GOWN DISPOSABLE) ×6 IMPLANT
NEEDLE HYPO 22GX1.5 SAFETY (NEEDLE) ×3 IMPLANT
NS IRRIG 1000ML POUR BTL (IV SOLUTION) ×3 IMPLANT
PACK ABDOMINAL GYN (CUSTOM PROCEDURE TRAY) ×3 IMPLANT
PAD OB MATERNITY 4.3X12.25 (PERSONAL CARE ITEMS) ×3 IMPLANT
PROTECTOR NERVE ULNAR (MISCELLANEOUS) ×3 IMPLANT
SPONGE LAP 18X18 X RAY DECT (DISPOSABLE) ×6 IMPLANT
STAPLER VISISTAT 35W (STAPLE) IMPLANT
SUT VIC AB 0 CT1 18XCR BRD8 (SUTURE) ×6 IMPLANT
SUT VIC AB 0 CT1 27 (SUTURE) ×1
SUT VIC AB 0 CT1 27XBRD ANBCTR (SUTURE) ×2 IMPLANT
SUT VIC AB 0 CT1 36 (SUTURE) ×3 IMPLANT
SUT VIC AB 0 CT1 8-18 (SUTURE) ×3
SUT VIC AB 2-0 CT1 27 (SUTURE) ×1
SUT VIC AB 2-0 CT1 TAPERPNT 27 (SUTURE) ×2 IMPLANT
SUT VIC AB 4-0 PS2 27 (SUTURE) ×3 IMPLANT
SUT VICRYL 0 TIES 12 18 (SUTURE) ×3 IMPLANT
SYR CONTROL 10ML LL (SYRINGE) ×3 IMPLANT
TOWEL OR 17X24 6PK STRL BLUE (TOWEL DISPOSABLE) ×6 IMPLANT
TRAY FOLEY CATH 14FR (SET/KITS/TRAYS/PACK) ×3 IMPLANT
WATER STERILE IRR 1000ML POUR (IV SOLUTION) ×3 IMPLANT

## 2012-02-20 NOTE — Transfer of Care (Deleted)
Immediate Anesthesia Transfer of Care Note  Patient: [name] [name]  Procedure(s) Performed: Procedure(s) (LRB) with comments: HYSTERECTOMY ABDOMINAL (N/A) BILATERAL SALPINGECTOMY (Bilateral)  Patient Location: PACU  Anesthesia Type: General  Level of Consciousness: awake, oriented and patient cooperative  Airway & Oxygen Therapy: Patient Spontanous Breathing and Patient connected to face mask oxygen  Post-op Assessment: Report given to PACU RN and Post -op Vital signs reviewed and stable  Post vital signs: Reviewed and stable  Complications: No apparent anesthesia complications

## 2012-02-20 NOTE — Preoperative (Signed)
Beta Blockers   Reason not to administer Beta Blockers:Hold beta blocker due to other;patient took medication at home this a.m. 

## 2012-02-20 NOTE — H&P (Signed)
Patient ID: [name] [name], female DOB: February 12, 1966, 46 y.o. MRN: [id number]  No chief complaint on file.  HPI  [name] [name] is a 46 y.o. Female.Patient's last menstrual period was 02/20/2012.G2P2   History of heavy menses and anemia requiring transfusion. Symptomatic fibroid uterus. Endometrial biopsy was done 11/30/11. Scheduled for TAH/BS. HPI  Past Medical History   Diagnosis  Date   .  Hypertension    .  Postpartum cardiomyopathy  1990     resolved    Past Surgical History   Procedure  Date   .  Laparoscopic cholecystectomy    .  Tubal ligation     Family History   Problem  Relation  Age of Onset   .  Diabetes  Mother    .  Hypertension  Mother    Social History  History   Substance Use Topics   .  Smoking status:  Never Smoker   .  Smokeless tobacco:  Not on file   .  Alcohol Use:  No    Allergies   Allergen  Reactions   .  Codeine  Swelling   .  Morphine And Related  Hives and Nausea And Vomiting     sweating    Current Outpatient Prescriptions   Medication  Sig  Dispense  Refill   .  amLODipine (NORVASC) 10 MG tablet  Take 1 tablet (10 mg total) by mouth daily.  30 tablet  1   .  atenolol (TENORMIN) 25 MG tablet  Take 1 tablet (25 mg total) by mouth daily.  30 tablet  1   .  ferrous sulfate (FERROUSUL) 325 (65 FE) MG tablet  Take 1 tablet (325 mg total) by mouth 2 (two) times daily.  60 tablet  1   .  medroxyPROGESTERone (DEPO-PROVERA) 150 MG/ML injection  Inject 1 mL (150 mg total) into the muscle every 3 (three) months.  1 mL  0    Current Facility-Administered Medications   Medication  Dose  Route  Frequency  Provider  Last Rate  Last Dose   .  medroxyPROGESTERone (DEPO-PROVERA) injection 150 mg  150 mg  Intramuscular  Q90 days  Allie Bossier, MD   150 mg at 11/30/11 1502   Review of Systems  Review of Systems  Constitutional: Negative for fever and activity change.  Gastrointestinal: Positive for abdominal pain and constipation. Negative for nausea.    Genitourinary: Positive for vaginal bleeding (clots, cramps) and menstrual problem. Negative for dysuria.   Physical Exam  Filed Vitals:   02/20/12 1205  BP: 154/84  Pulse: 62  Temp: 98.8 F (37.1 C)  Resp: 16    Physical Exam  Constitutional: She appears well-developed. No distress (obese).  Pulmonary/Chest: Effort normal.  Abdominal: Soft. She exhibits mass. There is no tenderness.  Genitourinary: Vagina normal.  Vaginal bleeding, uterus globular, 14-16 weeks size  Neurological: She is alert.  Skin: Skin is warm and dry.  Psychiatric: She has a normal mood and affect.  Data Reviewed   *RADIOLOGY REPORT*  Clinical Data: Leiomyoma of the uterus. LMP 11/14/2011  TRANSABDOMINAL ULTRASOUND OF PELVIS  Technique: Transabdominal ultrasound examination of the pelvis was  performed including evaluation of the uterus, ovaries, adnexal  regions, and pelvic cul-de-sac.  Comparison: Pelvic ultrasound 01/02/2011 and 12/07/2009  Findings:  Uterus: The uterus is enlarged, globular in shape, and anteverted.  It measures 15 x 10.6 x 11.5 cm. The myometrium is heterogeneous,  with linear areas of shadowing. Again seen is an ill-defined  area  of heterogeneous echotexture in the anterior uterine body, with a  submucosal component that measures approximately 12.2 x 6.5 x 10.6  cm. This could reflect a focal adenomyoma or a submucosal fibroid  (previously measured 10 x 5 x 6.8 cm in August 2012). There is  suggestion of displacement of /mass effect upon the endometrium by  this anterior uterine body process. The junctional zone between the  myometrium and endometrium is not well defined.  Endometrium: The endometrium is not well visualized, likely due to  the anterior uterine body adenomyoma versus fibroid.  Right ovary: Best visualized transabdominally. Measures 2.8 x 2.4  x 4.2 cm and appears within normal limits. No adnexal mass.  Left ovary: Not visualized, due to bowel gas in the left  adnexa.  Other Findings: No free fluid is seen.  IMPRESSION:  1.Enlarged globular uterus contains an ill-defined heterogeneous  mass-like area in the anterior uterine body, with a submucosal  component. This is either a focal adenomyoma (favored) or a  uterine fibroid and may have enlarged slightly since the prior  study. Pelvic MRI could be performed to distinguish between these  two entities, if desired.  2. Due to the abnormality in the anterior uterus, the endometrium  is not well visualized. Endometrial pathology cannot be excluded.  3. Normal appearance of the right ovary. Nonvisualization of the  left ovary.  Original Report Authenticated By: Britta Mccreedy, M.D.      Atrophic endometrium, normal pap 11/30/11  CBC    Component Value Date/Time   WBC 4.8 02/14/2012 1240   RBC 4.36 02/14/2012 1240   HGB 10.4* 02/14/2012 1240   HCT 34.4* 02/14/2012 1240   PLT 350 02/14/2012 1240   MCV 78.9 02/14/2012 1240   MCH 23.9* 02/14/2012 1240   MCHC 30.2 02/14/2012 1240   RDW 20.7* 02/14/2012 1240   LYMPHSABS 1.2 01/02/2011 1520   MONOABS 0.5 01/02/2011 1520   EOSABS 0.1 01/02/2011 1520   BASOSABS 0.0 01/02/2011 1520     CBC    Component  Value  Date/Time    WBC  9.4  11/16/2011 0422    RBC  4.57  11/16/2011 0422    HGB  9.0*  11/16/2011 0422    HCT  30.5*  11/16/2011 0422    PLT  340  11/16/2011 0422    MCV  66.7*  11/16/2011 0422    MCH  19.7*  11/16/2011 0422    MCHC  29.5*  11/16/2011 0422    RDW  23.4*  11/16/2011 0422    LYMPHSABS  1.2  01/02/2011 1520    MONOABS  0.5  01/02/2011 1520    EOSABS  0.1  01/02/2011 1520    BASOSABS  0.0  01/02/2011 1520   Assessment    Fibroid uterus, menorrhagia, anemia  Plan  TAH/bialteral salpingectomy. Oophorectomy discussed. Procedure and risks of anesthesia, bleeding, transfusion, visceral organ damage and pain were discussed and her questions were answered.  Received Lupron Depot 11.25 ARNOLD,JAMES  02/20/2012 12:33 PM

## 2012-02-20 NOTE — Anesthesia Postprocedure Evaluation (Signed)
  Anesthesia Post-op Note  Patient: [name] [name]  Procedure(s) Performed: Procedure(s) (LRB) with comments: HYSTERECTOMY ABDOMINAL (N/A) BILATERAL SALPINGECTOMY (Bilateral)  Patient is awake and responsive. Pain and nausea are reasonably well controlled. Vital signs are stable and clinically acceptable. Oxygen saturation is clinically acceptable. There are no apparent anesthetic complications at this time. Patient is ready for discharge.

## 2012-02-20 NOTE — Op Note (Signed)
Procedure: Total abdominal hysterectomy and bilateral salpingectomy Preoperative diagnosis: Symptomatic fibroid uterus and menorrhagia Postoperative diagnosis: Enlarged uterus and pelvic adhesions Surgeon: Dr. Scheryl Darter Asst. surgeon: Dr. Jaynie Collins Anesthesia: Gen. endotracheal Estimated blood loss: 400 mL Specimens uterus with separate cervix and fallopian tubes Drain: Foley catheter Counts: Correct Complications: None Urine output: 125 mL  Patient gave written consent for total bowel hysterectomy and bilateral salpingectomy due to enlarge fibroid uterus and menorrhagia and history of anemia. Patient identification was confirmed and she was brought to the operating room where general anesthesia was induced. She was placed in dorsal supine position. Exam under anesthesia revealed a globular 14-16 week size uterus. Foley catheter was placed and abdomen sterilely prepped and draped. #10 blade was used to make a transverse lower abdominal skin incision down to the fascia. Fascia was incised and the incision was extended transversely. The bellies of rectus muscles were separated at the midline. Peritoneum was entered at midline incision was extended. The enlarged globular firm uterus was visualized. The pelvis was explored and there were some adhesions from the pelvic sidewall to the uterine body on the left and also posterior to the uterus on the right. The dense adhesions were clamped cut and suture ligated and the others were cauterized. Incisions were made with cautery into the rectus muscles that centimeter and half medial to lateral on both sides in order to help mobilize the uterus out of the incision. The fundus was exteriorized. Kelly clamps were placed across the adnexal pedicles distal to the ovaries and these were cut and double suture ligatures with 0 Vicryl were placed. Round ligaments were clamped cut and suture ligated and bladder flap was created. Adhesions of the right adnexa were  lysed with Metzenbaum scissors. The uterine vessels were skeletonized and clamped with Heaney clamps and cut and suture ligated with 0 Vicryl. Backbleeding was controlled with clamps. Once the uterine vessels were ligated cautery was used to remove the uterine fundus from the cervix  to gain better exposure to the pelvis. The cardinal ligaments were clamped cut and suture ligated. The uterosacral ligaments were likewise cut and suture ligated and vagina was entered. The vaginal angles were clamped and ligated and the cervix was excised from the upper vagina. Vaginal cuff was closed with a running locking suture with 0 Vicryl. The pelvis was irrigated. Both fallopian tubes were identified and elevated and clamp was applied proximal to the tube and tubes were excised and pedicles were ligated with 0 Vicryl good hemostasis was seen. The ureters were seen to peristalse on both sides. After hemostasis was assured all retractors and lap pads were removed. Anterior peritoneum was closed with a running suture with 2-0 Vicryl. Fascia was closed with running suture with 0 Vicryl. Hemostasis was assured at the rectus muscles. Incision was irrigated good hemostasis was seen in interrupted sutures with 2-0 plain gut were placed in the subcutaneous layer. Skin was closed with a running subcuticular suture of 4-0 Vicryl. Sterile dressing was applied. Patient tolerated procedure well without complications. Uterus cervix and fallopian tubes were sent to pathology. Estimated blood loss was 400 mL. She is brought in stable condition to PACU.   Dr. Scheryl Darter 02/20/2012 3:44 PM

## 2012-02-20 NOTE — Anesthesia Preprocedure Evaluation (Signed)
Anesthesia Evaluation  Patient identified by MRN, date of birth, ID band Patient awake    Reviewed: Allergy & Precautions, H&P , Patient's Chart, lab work & pertinent test results, reviewed documented beta blocker date and time   Airway Mallampati: III TM Distance: >3 FB Neck ROM: full    Dental No notable dental hx. (+) Poor Dentition   Pulmonary  breath sounds clear to auscultation  Pulmonary exam normal       Cardiovascular hypertension, + dysrhythmias Supra Ventricular Tachycardia Rhythm:regular Rate:Normal - Friction Rub    Neuro/Psych    GI/Hepatic   Endo/Other  Morbid obesity  Renal/GU      Musculoskeletal   Abdominal   Peds  Hematology   Anesthesia Other Findings   Reproductive/Obstetrics                           Anesthesia Physical Anesthesia Plan  ASA: III  Anesthesia Plan: General   Post-op Pain Management:    Induction: Intravenous  Airway Management Planned: Oral ETT  Additional Equipment:   Intra-op Plan:   Post-operative Plan:   Informed Consent: I have reviewed the patients History and Physical, chart, labs and discussed the procedure including the risks, benefits and alternatives for the proposed anesthesia with the patient or authorized representative who has indicated his/her understanding and acceptance.   Dental Advisory Given and Dental advisory given  Plan Discussed with: CRNA and Surgeon  Anesthesia Plan Comments: (Plan Light wand for avoidance of Dental injury  Discussed Poor dentition and possibility of Regional vs general anesthesia, including possible nausea, instrumentation of airway, sore throat,pulmonary aspiration, etc. I asked if the were any outstanding questions, or  concerns before we proceeded. )        Anesthesia Quick Evaluation

## 2012-02-20 NOTE — Transfer of Care (Signed)
Immediate Anesthesia Transfer of Care Note  Patient: [name] [name]  Procedure(s) Performed: Procedure(s) (LRB) with comments: HYSTERECTOMY ABDOMINAL (N/A) BILATERAL SALPINGECTOMY (Bilateral)  Patient Location: PACU  Anesthesia Type: General  Level of Consciousness: awake, oriented and patient cooperative  Airway & Oxygen Therapy: Patient Spontanous Breathing and Patient connected to face mask oxygen  Post-op Assessment: Report given to PACU RN and Post -op Vital signs reviewed and stable  Post vital signs: Reviewed and stable  Complications: No apparent anesthesia complications 

## 2012-02-21 ENCOUNTER — Encounter (HOSPITAL_COMMUNITY): Payer: Self-pay | Admitting: Obstetrics & Gynecology

## 2012-02-21 LAB — CBC
HCT: 28.1 % — ABNORMAL LOW (ref 36.0–46.0)
MCV: 77.6 fL — ABNORMAL LOW (ref 78.0–100.0)
RBC: 3.62 MIL/uL — ABNORMAL LOW (ref 3.87–5.11)
WBC: 10.3 10*3/uL (ref 4.0–10.5)

## 2012-02-21 MED ORDER — KETOROLAC TROMETHAMINE 10 MG PO TABS
10.0000 mg | ORAL_TABLET | Freq: Four times a day (QID) | ORAL | Status: DC
  Administered 2012-02-21 – 2012-02-22 (×3): 10 mg via ORAL
  Filled 2012-02-21 (×9): qty 1

## 2012-02-21 MED ORDER — ONDANSETRON HCL 4 MG PO TABS
4.0000 mg | ORAL_TABLET | Freq: Three times a day (TID) | ORAL | Status: DC | PRN
  Administered 2012-02-21: 4 mg via ORAL

## 2012-02-21 MED ORDER — OXYCODONE-ACETAMINOPHEN 5-325 MG PO TABS: 1.0000 | ORAL_TABLET | ORAL | Status: DC | PRN

## 2012-02-21 MED ORDER — TRAMADOL-ACETAMINOPHEN 37.5-325 MG PO TABS
1.0000 | ORAL_TABLET | ORAL | Status: DC | PRN
  Filled 2012-02-21: qty 2

## 2012-02-21 MED ORDER — TRAMADOL HCL 50 MG PO TABS
50.0000 mg | ORAL_TABLET | Freq: Four times a day (QID) | ORAL | Status: DC | PRN
  Administered 2012-02-21 – 2012-02-22 (×3): 50 mg via ORAL
  Filled 2012-02-21 (×3): qty 1

## 2012-02-21 MED ORDER — ACETAMINOPHEN 325 MG PO TABS
325.0000 mg | ORAL_TABLET | Freq: Four times a day (QID) | ORAL | Status: DC | PRN
  Administered 2012-02-21: 325 mg via ORAL
  Filled 2012-02-21: qty 1

## 2012-02-21 NOTE — Progress Notes (Signed)
UR Chart review completed.  

## 2012-02-21 NOTE — Progress Notes (Signed)
Pt is taking good PO so weaning off IV and to PO pain medication.  Allergy listed for codeine and morphine analogues but pt states this is only nausea and wants to take this and have zofran as well for nausea.  Ordered and instructed nursing to monitor for hives and call MD for hives/difficulty breathing, which I do not expect.  Simone Curia 02/21/2012 10:27 AM

## 2012-02-21 NOTE — Progress Notes (Signed)
1 Day Post-Op Procedure(s) (LRB): HYSTERECTOMY ABDOMINAL (N/A) BILATERAL SALPINGECTOMY (Bilateral)  Subjective: Patient reports incisional pain and tolerating PO.    Objective: I have reviewed patient's vital signs, intake and output, medications and labs.  General: alert, cooperative and no distress Resp: effort normal Cardio: regular rate and rhythm GI: soft, non-tender; bowel sounds normal; no masses,  no organomegaly and incision: clean, dry and intact Extremities: extremities normal, atraumatic, no cyanosis or edema Vaginal Bleeding: none  Assessment: s/p Procedure(s) (LRB) with comments: HYSTERECTOMY ABDOMINAL (N/A) BILATERAL SALPINGECTOMY (Bilateral): progressing well CBC    Component Value Date/Time   WBC 10.3 02/21/2012 0510   RBC 3.62* 02/21/2012 0510   HGB 8.8* 02/21/2012 0510   HCT 28.1* 02/21/2012 0510   PLT 302 02/21/2012 0510   MCV 77.6* 02/21/2012 0510   MCH 24.3* 02/21/2012 0510   MCHC 31.3 02/21/2012 0510   RDW 18.9* 02/21/2012 0510   LYMPHSABS 1.2 01/02/2011 1520   MONOABS 0.5 01/02/2011 1520   EOSABS 0.1 01/02/2011 1520   BASOSABS 0.0 01/02/2011 1520    Plan: Advance diet Encourage ambulation Advance to PO medication Discontinue IV fluids  LOS: 1 day    ARNOLD,JAMES 02/21/2012, 10:25 AM

## 2012-02-22 MED ORDER — ATENOLOL 25 MG PO TABS: 25.0000 mg | ORAL_TABLET | Freq: Every day | ORAL | Status: DC

## 2012-02-22 MED ORDER — TRAMADOL-ACETAMINOPHEN 37.5-325 MG PO TABS: 1.0000 | ORAL_TABLET | Freq: Four times a day (QID) | ORAL | Status: DC | PRN

## 2012-02-22 MED ORDER — AMLODIPINE BESYLATE 10 MG PO TABS: 10.0000 mg | ORAL_TABLET | Freq: Every day | ORAL | Status: DC

## 2012-02-22 NOTE — Discharge Summary (Signed)
Physician Discharge Summary  Patient ID: [name] [name] MRN: [id number] DOB/AGE: 1965-10-04 45 y.o.  Admit date: 02/20/2012 Discharge date: 02/22/2012  Admission Diagnoses:Menorrhagia, fibroid uterus  Discharge Diagnoses: same Active Problems:  * No active hospital problems. *    Discharged Condition: good  Hospital Course: Surgery 02/21/12 TAH/BS  Consults: None  Significant Diagnostic Studies: labs:  CBC    Component Value Date/Time   WBC 10.3 02/21/2012 0510   RBC 3.62* 02/21/2012 0510   HGB 8.8* 02/21/2012 0510   HCT 28.1* 02/21/2012 0510   PLT 302 02/21/2012 0510   MCV 77.6* 02/21/2012 0510   MCH 24.3* 02/21/2012 0510   MCHC 31.3 02/21/2012 0510   RDW 18.9* 02/21/2012 0510   LYMPHSABS 1.2 01/02/2011 1520   MONOABS 0.5 01/02/2011 1520   EOSABS 0.1 01/02/2011 1520   BASOSABS 0.0 01/02/2011 1520      Treatments: surgery: TAH/BS  Discharge Exam: Blood pressure 117/74, pulse 59, temperature 98.3 F (36.8 C), temperature source Oral, resp. rate 18, height 5\' 7"  (1.702 m), weight 247 lb (112.038 kg), last menstrual period 02/20/2012, SpO2 62.00%. General appearance: alert, cooperative and no distress GI: soft, non-tender; bowel sounds normal; no masses,  no organomegaly and dressing dry and intact Extremities: Homans sign is negative, no sign of DVT   Disposition: 01-Home or Self Care  Discharge Orders    Future Appointments: Provider: Department: Dept Phone: Center:   03/29/2012 4:15 PM Adam Phenix, MD [facility] [phone] [facility]       Medication List     As of 02/22/2012 10:24 AM    STOP taking these medications         medroxyPROGESTERone 10 MG tablet   Commonly known as: PROVERA      TAKE these medications         amLODipine 10 MG tablet   Commonly known as: NORVASC   Take 1 tablet (10 mg total) by mouth daily.      amLODipine 10 MG tablet   Commonly known as: NORVASC   Take 10 mg by mouth daily.      atenolol 25 MG tablet   Commonly known  as: TENORMIN   Take 1 tablet (25 mg total) by mouth daily.      atenolol 25 MG tablet   Commonly known as: TENORMIN   Take 25 mg by mouth daily.      ferrous sulfate 325 (65 FE) MG tablet   Take 325 mg by mouth 2 (two) times daily with a meal.      traMADol-acetaminophen 37.5-325 MG per tablet   Commonly known as: ULTRACET   Take 1 tablet by mouth every 6 (six) hours as needed for pain.      f/u 4 weeks GYN clinic Post op instructions given  Signed: Khrystyne Arpin 02/22/2012, 10:24 AM

## 2012-02-29 ENCOUNTER — Encounter (HOSPITAL_COMMUNITY): Payer: Self-pay

## 2012-02-29 ENCOUNTER — Inpatient Hospital Stay (HOSPITAL_COMMUNITY)
Admission: AD | Admit: 2012-02-29 | Discharge: 2012-02-29 | Disposition: A | Discharge status: Home / Self Care | Payer: MEDICAID | Source: Ambulatory Visit | Attending: Obstetrics & Gynecology | Admitting: Obstetrics & Gynecology

## 2012-02-29 DIAGNOSIS — R42 Dizziness and giddiness: Secondary | ICD-10-CM | POA: Insufficient documentation

## 2012-02-29 LAB — CBC
MCHC: 30.6 g/dL (ref 30.0–36.0)
Platelets: 425 10*3/uL — ABNORMAL HIGH (ref 150–400)
RDW: 17.5 % — ABNORMAL HIGH (ref 11.5–15.5)
WBC: 7.2 10*3/uL (ref 4.0–10.5)

## 2012-02-29 LAB — URINALYSIS, ROUTINE W REFLEX MICROSCOPIC
Hgb urine dipstick: NEGATIVE
Nitrite: NEGATIVE
Protein, ur: NEGATIVE mg/dL
Specific Gravity, Urine: <1.005 — ABNORMAL LOW (ref 1.005–1.030)
Urobilinogen, UA: 0.2 mg/dL (ref 0.0–1.0)

## 2012-02-29 MED ORDER — MECLIZINE HCL 50 MG PO TABS: 50.0000 mg | ORAL_TABLET | Freq: Three times a day (TID) | ORAL | Status: DC | PRN

## 2012-02-29 MED ORDER — MECLIZINE HCL 25 MG PO TABS
50.0000 mg | ORAL_TABLET | Freq: Once | ORAL | Status: AC
  Administered 2012-02-29: 50 mg via ORAL
  Filled 2012-02-29: qty 2

## 2012-02-29 NOTE — MAU Note (Signed)
Pt states had hysterectomy for heavy bleeding and fibroids on 02/20/2012. Post op no issues until today. Extreme dizziness.

## 2012-02-29 NOTE — MAU Note (Signed)
Meclizine effective, no adverse reaction

## 2012-02-29 NOTE — MAU Provider Note (Signed)
History    [name] [name] 46 y.o. female  CSN: [id number]  Arrival date and time: 02/29/12 1244   First Provider Initiated Contact with Patient 02/29/12 1347      Chief Complaint  Patient presents with  . 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 She had no complications from the surgery in the immediate post op period. She has been walking daily and exercising. She has been able to tolerate three meals a day. She reports she is voiding appropriately. Last night she had a episode of vertigo that started after she went to bed and and rolled to her left side. She stated she felt the room spin. She attempted to reposition herself to make it stop and after laying on her back for a few minutes she was able to fall asleep. She experienced another episode this morning when she attempted to get out of bed. She tilted to the left and again became severely "Dizzy."  She has taken her HTN medications and prior to today she had been taking her pain medication from her surgery. She had not had a BM and had taken a equate enema last night, just prior to laying down.   Past Medical History  Diagnosis Date  . Hypertension   . Postpartum cardiomyopathy 1990    resolved  . Abnormal uterine bleeding   . Headache   . Anemia   . Dysrhythmia 2013     brief episodes of sinus tachycardia- controlled by med    Past Surgical History  Procedure Date  . Tubal ligation   . Gallstones   . Abdominal hysterectomy 02/20/2012    Procedure: HYSTERECTOMY ABDOMINAL;  Surgeon: Adam Phenix, MD;  Location: [facility];  Service: Gynecology;  Laterality: N/A;    Family History  Problem Relation Age of Onset  . Diabetes Mother   . Hypertension Mother     History  Substance Use Topics  . Smoking status: Never Smoker   . Smokeless tobacco: Not on file  . Alcohol Use: No    Allergies:  Allergies  Allergen Reactions  . Codeine Hives, Nausea Only and  Other (See Comments)    headaches  . Morphine And Related Nausea And Vomiting and Other (See Comments)    headaches    Prescriptions prior to admission  Medication Sig Dispense Refill  . amLODipine (NORVASC) 10 MG tablet Take 10 mg by mouth daily.      Marland Kitchen atenolol (TENORMIN) 25 MG tablet Take 25 mg by mouth daily.      . ferrous sulfate 325 (65 FE) MG tablet Take 325 mg by mouth 2 (two) times daily with a meal.      . ibuprofen (ADVIL,MOTRIN) 200 MG tablet Take 600 mg by mouth daily as needed. For pain      . traMADol-acetaminophen (ULTRACET) 37.5-325 MG per tablet Take 1 tablet by mouth every 6 (six) hours as needed for pain.  30 tablet  0    Review of Systems  Constitutional: Negative for fever, chills and malaise/fatigue.  HENT: Negative for ear pain, nosebleeds, congestion and tinnitus.   Eyes: Negative for pain.  Respiratory: Negative for cough, shortness of breath and wheezing.   Cardiovascular: Negative for chest pain, palpitations and leg swelling.  Gastrointestinal: Positive for constipation. Negative for nausea, vomiting, abdominal pain and diarrhea.  Genitourinary: Negative for dysuria, urgency and frequency.  Neurological: Positive for dizziness and headaches. Negative for weakness.  Physical Exam   Blood pressure 123/77, pulse 74, temperature 99.2 F (37.3 C), temperature source Oral, resp. rate 20, height 5\' 7"  (1.702 m), weight 112.18 kg (247 lb 5 oz), last menstrual period 02/14/2012, SpO2 99.00%.  Physical Exam  Constitutional: She appears well-developed and well-nourished. No distress.  HENT:  Head: Normocephalic and atraumatic.  Nose: Nose normal.  Mouth/Throat: Oropharynx is clear and moist. No oropharyngeal exudate.  Eyes: Pupils are equal, round, and reactive to light. Right eye exhibits no discharge. Left eye exhibits no discharge. No scleral icterus.  Cardiovascular: Normal rate and normal heart sounds.   No murmur heard. Respiratory: Effort normal and  breath sounds normal. No respiratory distress. She has no wheezes. She has no rales.  GI: Soft. Bowel sounds are normal. She exhibits no distension and no mass. There is no rebound and no guarding.       appropriately tender for post-op hysterectomy.   Skin: She is not diaphoretic.    MAU Course  Procedures CBC and POCT urine Vertigo: Meclizine 50 mg now Encourage PO fluids  Assessment and Plan  1. Vertigo: Meclizine 50 mg PO TID   2. F/U with your provider if you continue to have symptoms.   Felix Pacini 02/29/2012, 2:12 PM

## 2012-02-29 NOTE — MAU Note (Signed)
Had been constipated, did an enema yesterday- + results, first BM since surgery.  Denies problems with urination.

## 2012-02-29 NOTE — MAU Note (Signed)
A lot of nausea, head is spinning, really light headed. Last night was in bed, rolled over to get up and go to the restroom and the whole room started spinning.  Had an abd hysterectomy on Mon, Sept 23.

## 2012-03-21 NOTE — MAU Provider Note (Signed)
Agree with note, reviewed with resident Camie Hauss

## 2012-03-29 ENCOUNTER — Ambulatory Visit (INDEPENDENT_AMBULATORY_CARE_PROVIDER_SITE_OTHER): Payer: Self-pay | Admitting: Obstetrics & Gynecology

## 2012-03-29 ENCOUNTER — Encounter: Payer: Self-pay | Admitting: Obstetrics & Gynecology

## 2012-03-29 VITALS — BP 140/91 | HR 95 | Temp 97.5°F | Ht 67.25 in | Wt 249.8 lb

## 2012-03-29 DIAGNOSIS — Z09 Encounter for follow-up examination after completed treatment for conditions other than malignant neoplasm: Secondary | ICD-10-CM

## 2012-03-29 DIAGNOSIS — D259 Leiomyoma of uterus, unspecified: Secondary | ICD-10-CM

## 2012-03-29 DIAGNOSIS — Z23 Encounter for immunization: Secondary | ICD-10-CM

## 2012-03-29 DIAGNOSIS — Z9889 Other specified postprocedural states: Secondary | ICD-10-CM

## 2012-03-29 MED ORDER — INFLUENZA VIRUS VACC SPLIT PF IM SUSP
0.5000 mL | Freq: Once | INTRAMUSCULAR | Status: AC
  Administered 2012-03-29: 0.5 mL via INTRAMUSCULAR

## 2012-03-29 NOTE — Progress Notes (Signed)
Patient ID: [name] [name], female   DOB: 1965-07-27, 46 y.o.   MRN: [id number] Subjective:     [name] [name] is a 46 y.o. female who presents to the clinic 5 weeks status post total abdominal hysterectomy and bilateral oophorectomy for fibroids. Eating a regular diet without difficulty. Bowel movements are normal. Pain is controlled without any medications.  The following portions of the patient's history were reviewed and updated as appropriate: allergies, current medications, past family history, past medical history, past social history, past surgical history and problem list.  Review of Systems Genitourinary:positive for improving pain, some vertigo, dysuria and no bleeding    Objective:    BP 140/91  Pulse 95  Temp 97.5 F (36.4 C) (Oral)  Ht 5' 7.25" (1.708 m)  Wt 249 lb 12.8 oz (113.309 kg)  BMI 38.83 kg/m2  LMP 02/14/2012 General:  alert, cooperative and no distress  Abdomen: soft, non-tender  Incision:   healing well, no drainage, no erythema, no hernia, no seroma, no swelling, no dehiscence, incision well approximated     Assessment:    Doing well postoperatively. Operative findings again reviewed. Pathology report discussed.    Plan:    1. Continue any current medications. 2. Wound care discussed. 3. Activity restrictions: none 4. Anticipated return to work: 1-2 weeks. 5. Follow up: n/a  .    ARNOLD,JAMES 03/29/2012 4:43 PM

## 2012-03-29 NOTE — Patient Instructions (Signed)
Hysterectomy  Care After  Refer to this sheet in the next few weeks. These instructions provide you with information on caring for yourself after your procedure. Your caregiver may also give you more specific instructions. Your treatment has been planned according to current medical practices, but problems sometimes occur. Call your caregiver if you have any problems or questions after your procedure.  HOME CARE INSTRUCTIONS   Healing will take time. You may have discomfort, tenderness, swelling, and bruising at the surgical site for about 2 weeks. This is normal and will get better as time goes on.   Only take over-the-counter or prescription medicines for pain, discomfort, or fever as directed by your caregiver.   Do not take aspirin. It can cause bleeding.   Do not drive when taking pain medicine.   Follow your caregiver's advice regarding exercise, lifting, driving, and general activities.   Resume your usual diet as directed and allowed.   Get plenty of rest and sleep.   Do not douche, use tampons, or have sexual intercourse for at least 6 weeks or until your caregiver gives you permission.   Change your bandages (dressings) as directed by your caregiver.   Monitor your temperature.   Take showers instead of baths for 2 to 3 weeks.   Do not drink alcohol until your caregiver gives you permission.   If you are constipated, you may take a mild laxative with your caregiver's permission. Bran foods may help with constipation problems. Drinking enough fluids to keep your urine clear or pale yellow may help as well.   Try to have someone home with you for 1 or 2 weeks to help around the house.   Keep all of your follow-up appointments as directed by your caregiver.  SEEK MEDICAL CARE IF:    You have swelling, redness, or increasing pain in the surgical cut (incision) area.   You have pus coming from the incision.   You notice a bad smell coming from the incision or dressing.   You have swelling,  redness, or pain around the intravenous (IV) site.   Your incision breaks open.   You feel dizzy or lightheaded.   You have pain or bleeding when you urinate.   You have persistent diarrhea.   You have persistent nausea and vomiting.   You have abnormal vaginal discharge.   You have a rash.   You have any type of abnormal reaction or develop an allergy to your medicine.   Your pain is not controlled with your prescribed medicine.  SEEK IMMEDIATE MEDICAL CARE IF:    You have a fever.   You have severe abdominal pain.   You have chest pain.   You have shortness of breath.   You faint.   You have pain, swelling, or redness of your leg.   You have heavy vaginal bleeding with blood clots.  MAKE SURE YOU:   Understand these instructions.   Will watch your condition.   Will get help right away if you are not doing well or get worse.  Document Released: 12/03/2004 Document Revised: 08/08/2011 Document Reviewed: 12/31/2010  [employer] Patient Information 2013 [employer]

## 2013-04-19 ENCOUNTER — Inpatient Hospital Stay (HOSPITAL_COMMUNITY)
Admission: EM | Admit: 2013-04-19 | Discharge: 2013-04-21 | DRG: 871 | Disposition: A | Discharge status: Home / Self Care | Payer: Medicaid - Out of State | Attending: Internal Medicine | Admitting: Internal Medicine

## 2013-04-19 ENCOUNTER — Emergency Department (HOSPITAL_COMMUNITY): Payer: Medicaid - Out of State

## 2013-04-19 ENCOUNTER — Encounter (HOSPITAL_COMMUNITY): Payer: Self-pay | Admitting: Emergency Medicine

## 2013-04-19 DIAGNOSIS — E876 Hypokalemia: Secondary | ICD-10-CM | POA: Diagnosis present

## 2013-04-19 DIAGNOSIS — R7401 Elevation of levels of liver transaminase levels: Secondary | ICD-10-CM | POA: Diagnosis present

## 2013-04-19 DIAGNOSIS — D509 Iron deficiency anemia, unspecified: Secondary | ICD-10-CM | POA: Diagnosis present

## 2013-04-19 DIAGNOSIS — Z8249 Family history of ischemic heart disease and other diseases of the circulatory system: Secondary | ICD-10-CM

## 2013-04-19 DIAGNOSIS — J189 Pneumonia, unspecified organism: Secondary | ICD-10-CM

## 2013-04-19 DIAGNOSIS — R7402 Elevation of levels of lactic acid dehydrogenase (LDH): Secondary | ICD-10-CM | POA: Diagnosis present

## 2013-04-19 DIAGNOSIS — Z833 Family history of diabetes mellitus: Secondary | ICD-10-CM

## 2013-04-19 DIAGNOSIS — E860 Dehydration: Secondary | ICD-10-CM | POA: Diagnosis present

## 2013-04-19 DIAGNOSIS — R6510 Systemic inflammatory response syndrome (SIRS) of non-infectious origin without acute organ dysfunction: Secondary | ICD-10-CM

## 2013-04-19 DIAGNOSIS — K7200 Acute and subacute hepatic failure without coma: Secondary | ICD-10-CM | POA: Diagnosis present

## 2013-04-19 DIAGNOSIS — I10 Essential (primary) hypertension: Secondary | ICD-10-CM | POA: Diagnosis present

## 2013-04-19 DIAGNOSIS — A419 Sepsis, unspecified organism: Principal | ICD-10-CM | POA: Diagnosis present

## 2013-04-19 DIAGNOSIS — R110 Nausea: Secondary | ICD-10-CM

## 2013-04-19 LAB — COMPREHENSIVE METABOLIC PANEL
ALT: 98 U/L — ABNORMAL HIGH (ref 0–35)
AST: 125 U/L — ABNORMAL HIGH (ref 0–37)
Albumin: 3.6 g/dL (ref 3.5–5.2)
CO2: 24 mEq/L (ref 19–32)
Calcium: 9.4 mg/dL (ref 8.4–10.5)
Creatinine, Ser: 1.03 mg/dL (ref 0.50–1.10)
GFR calc non Af Amer: 64 mL/min — ABNORMAL LOW (ref >90)
Sodium: 133 mEq/L — ABNORMAL LOW (ref 135–145)

## 2013-04-19 LAB — URINALYSIS, ROUTINE W REFLEX MICROSCOPIC
Bilirubin Urine: NEGATIVE
Glucose, UA: NEGATIVE mg/dL
Hgb urine dipstick: NEGATIVE
Specific Gravity, Urine: 1.027 (ref 1.005–1.030)
pH: 6 (ref 5.0–8.0)

## 2013-04-19 LAB — POCT I-STAT TROPONIN I: Troponin i, poc: 0 ng/mL (ref 0.00–0.08)

## 2013-04-19 LAB — CBC
MCH: 28.3 pg (ref 26.0–34.0)
MCV: 82 fL (ref 78.0–100.0)
Platelets: 233 10*3/uL (ref 150–400)
RBC: 5.01 MIL/uL (ref 3.87–5.11)
RDW: 13.7 % (ref 11.5–15.5)
WBC: 10.6 10*3/uL — ABNORMAL HIGH (ref 4.0–10.5)

## 2013-04-19 MED ORDER — SODIUM CHLORIDE 0.9 % IV BOLUS (SEPSIS)
1000.0000 mL | Freq: Once | INTRAVENOUS | Status: AC
  Administered 2013-04-19: 1000 mL via INTRAVENOUS

## 2013-04-19 MED ORDER — DEXTROSE 5 % IV SOLN
500.0000 mg | Freq: Once | INTRAVENOUS | Status: AC
  Administered 2013-04-20: 500 mg via INTRAVENOUS

## 2013-04-19 MED ORDER — DEXTROSE 5 % IV SOLN
1.0000 g | Freq: Once | INTRAVENOUS | Status: AC
  Administered 2013-04-19: 1 g via INTRAVENOUS
  Filled 2013-04-19: qty 10

## 2013-04-19 MED ORDER — HYDROCODONE-ACETAMINOPHEN 5-325 MG PO TABS
1.0000 | ORAL_TABLET | Freq: Once | ORAL | Status: AC
  Administered 2013-04-19: 1 via ORAL
  Filled 2013-04-19: qty 1

## 2013-04-19 MED ORDER — IOHEXOL 350 MG/ML SOLN
80.0000 mL | Freq: Once | INTRAVENOUS | Status: AC | PRN
  Administered 2013-04-19: 80 mL via INTRAVENOUS

## 2013-04-19 MED ORDER — DIPHENHYDRAMINE HCL 50 MG/ML IJ SOLN
25.0000 mg | Freq: Once | INTRAMUSCULAR | Status: AC
  Administered 2013-04-19: 25 mg via INTRAVENOUS
  Filled 2013-04-19: qty 1

## 2013-04-19 NOTE — ED Notes (Signed)
To ct

## 2013-04-19 NOTE — ED Notes (Signed)
Pt asked to   Give a urine.  She is unable to void at present

## 2013-04-19 NOTE — ED Notes (Signed)
The pts pain over her body is better except for her headache

## 2013-04-19 NOTE — ED Notes (Signed)
The pt is c/o pain all over her body especially when she moves.  She is lying stiff in the bed not moving.  Alert skin warm and dry

## 2013-04-19 NOTE — ED Notes (Signed)
Pt  Getting  Blood cultures.  Antibiotics after the cultures drawn

## 2013-04-19 NOTE — ED Notes (Addendum)
Pt states she has felt weak x 3 days after driving from [place] on Monday.  She has been taking pain meds since having dental surgery and thought that was why she felt weak.  Today she began experiencing chest pain and body pain.  Pt cries in pain if you touch her arms.  HR 138 ST. Pt states she took advil at 1530 for temp of 102.0

## 2013-04-19 NOTE — ED Notes (Signed)
Iv team called for a  A-c iv for  c-t

## 2013-04-19 NOTE — ED Notes (Signed)
The iv team here

## 2013-04-19 NOTE — ED Notes (Signed)
The pts had advil at 1530 or 1600

## 2013-04-19 NOTE — ED Notes (Signed)
Rocephin hung  3rd liter of nss hung

## 2013-04-19 NOTE — ED Provider Notes (Signed)
CSN: [id number]     Arrival date & time 04/19/13  1903 History   First MD Initiated Contact with Patient 04/19/13 1938     Chief Complaint  Patient presents with  . Chest Pain  . Shortness of Breath    Patient is a 47 y.o. female presenting with weakness. The history is provided by the patient.  Weakness This is a new problem. The current episode started 2 days ago. The problem occurs constantly. The problem has been gradually worsening. Associated symptoms include chest pain and shortness of breath. Pertinent negatives include no abdominal pain. Exacerbated by: acitivity. The symptoms are relieved by rest. She has tried rest for the symptoms. The treatment provided mild relief.  pt reports for over 2 days she has had fatigue, short of breath, cough, CP (none at this time) as well as fever up to 101 today  She reports dental extraction but has been taking ABX She reports she is in town for holidays No recent foreign travel  Past Medical History  Diagnosis Date  . Hypertension   . Postpartum cardiomyopathy 1990    resolved  . Abnormal uterine bleeding   . Headache(784.0)   . Anemia   . Dysrhythmia 2013     brief episodes of sinus tachycardia- controlled by med   Past Surgical History  Procedure Laterality Date  . Tubal ligation    . Gallstones    . Abdominal hysterectomy  02/20/2012    Procedure: HYSTERECTOMY ABDOMINAL;  Surgeon: Adam Phenix, MD;  Location: [facility];  Service: Gynecology;  Laterality: N/A;   Family History  Problem Relation Age of Onset  . Diabetes Mother   . Hypertension Mother    History  Substance Use Topics  . Smoking status: Never Smoker   . Smokeless tobacco: Not on file  . Alcohol Use: No   OB History   Grav Para Term Preterm Abortions TAB SAB Ect Mult Living   2 2        2      Review of Systems  Constitutional: Positive for fever and fatigue.  Respiratory: Positive for cough and shortness of breath.   Cardiovascular: Positive for chest  pain.  Gastrointestinal: Negative for vomiting, abdominal pain and diarrhea.  Neurological: Positive for weakness.  Psychiatric/Behavioral: The patient is nervous/anxious.   All other systems reviewed and are negative.    Allergies  Codeine and Morphine and related  Home Medications   Current Outpatient Rx  Name  Route  Sig  Dispense  Refill  . amoxicillin (AMOXIL) 250 MG capsule   Oral   Take 250 mg by mouth every 8 (eight) hours.         . chlorothiazide (DIURIL) 250 MG tablet   Oral   Take 250 mg by mouth every morning.         . ferrous sulfate 325 (65 FE) MG tablet   Oral   Take 325 mg by mouth 2 (two) times daily with a meal.         . ibuprofen (ADVIL,MOTRIN) 200 MG tablet   Oral   Take 600 mg by mouth daily as needed. For pain         . LISINOPRIL PO   Oral   Take 1 tablet by mouth daily.         Marland Kitchen HYDROcodone-acetaminophen (NORCO/VICODIN) 5-325 MG per tablet   Oral   Take 1 tablet by mouth every 6 (six) hours as needed for moderate pain.  BP 135/82  Pulse 142  Temp(Src) 99.8 F (37.7 C) (Oral)  Resp 22  Ht 5\' 7"  (1.702 m)  Wt 253 lb (114.76 kg)  BMI 39.62 kg/m2  SpO2 98%  LMP 02/14/2012 Physical Exam CONSTITUTIONAL: Well developed/well nourished, anxious HEAD: Normocephalic/atraumatic EYES: EOMI/PERRL ENMT: Mucous membranes moist NECK: supple no meningeal signs SPINE:entire spine nontender CV: tachycardic S1/S2 noted, no murmurs/rubs/gallops noted LUNGS: crackles in right base.  No distress noted ABDOMEN: soft, nontender, no rebound or guarding GU:no cva tenderness NEURO: Pt is awake/alert, moves all extremitiesx4 EXTREMITIES: pulses normal, full ROM SKIN: warm, color normal PSYCH: no abnormalities of mood noted  ED Course  Procedures (including critical care time) CRITICAL CARE Performed by: Joya Gaskins Total critical care time: 32 Critical care time was exclusive of separately billable procedures and treating  other patients. Critical care was necessary to treat or prevent imminent or life-threatening deterioration. Critical care was time spent personally by me on the following activities: development of treatment plan with patient and/or surrogate as well as nursing, discussions with consultants, evaluation of patient's response to treatment, examination of patient, obtaining history from patient or surrogate, ordering and performing treatments and interventions, ordering and review of laboratory studies, ordering and review of radiographic studies, pulse oximetry and re-evaluation of patient's condition.   8:19 PM Pt with flu like illness for two days IV fluids ordered Will follow closely Pt denies recent foreign travel 11:49 PM Patient has been given up to 2L normal saline, but now her BP is trending down (SBP 94) with known h/o HTN Her heart rate is improved Her CT shows nodules vs ?emboli History suggested pneumonia (cough/sob/fever) will treat for community acquired pneumonia However, septic emboli possible (recent dental extraction) Blood cultures have been ordered  D/w [facility], will admit Patient agreeable with plan  Labs Review Labs Reviewed  CBC - Abnormal; Notable for the following:    WBC 10.6 (*)    All other components within normal limits  COMPREHENSIVE METABOLIC PANEL  URINALYSIS, ROUTINE W REFLEX MICROSCOPIC  POCT I-STAT TROPONIN I  POCT LACTIC ACID (LACTATE)   Imaging Review No results found.  EKG Interpretation    Date/Time:  Friday April 19 2013 19:09:58 EST Ventricular Rate:  139 PR Interval:  170 QRS Duration: 96 QT Interval:  335 QTC Calculation: 509 R Axis:   17 Text Interpretation:  Sinus tachycardia Probable left atrial enlargement Low voltage, extremity and precordial leads Nonspecific T abnormalities, lateral leads Minimal ST elevation, inferior leads Borderline prolonged QT interval Baseline wander in lead(s) III Confirmed by Bebe Shaggy  MD, Blimy Napoleon  [id number]) on 04/19/2013 8:19:13 PM            MDM  No diagnosis found. Nursing notes including past medical history and social history reviewed and considered in documentation xrays reviewed and considered Labs/vital reviewed and considered Previous records reviewed and considered - previous discharge summaries reviewed     Joya Gaskins, MD 04/19/13 2353

## 2013-04-19 NOTE — ED Notes (Signed)
The pt has had a fine raised red rash since she arrived here .   However after she arrived back from c-t she is now itching.  Benadryl given iv for that.  Waiting for labs and c-t results

## 2013-04-20 ENCOUNTER — Encounter (HOSPITAL_COMMUNITY): Payer: Self-pay | Admitting: Nurse Practitioner

## 2013-04-20 DIAGNOSIS — I10 Essential (primary) hypertension: Secondary | ICD-10-CM

## 2013-04-20 DIAGNOSIS — E876 Hypokalemia: Secondary | ICD-10-CM

## 2013-04-20 DIAGNOSIS — J189 Pneumonia, unspecified organism: Secondary | ICD-10-CM | POA: Diagnosis present

## 2013-04-20 DIAGNOSIS — A419 Sepsis, unspecified organism: Secondary | ICD-10-CM | POA: Diagnosis present

## 2013-04-20 LAB — CBC WITH DIFFERENTIAL/PLATELET
Basophils Absolute: 0 10*3/uL (ref 0.0–0.1)
Basophils Relative: 0 % (ref 0–1)
Eosinophils Absolute: 0 10*3/uL (ref 0.0–0.7)
HCT: 35.4 % — ABNORMAL LOW (ref 36.0–46.0)
Hemoglobin: 12.1 g/dL (ref 12.0–15.0)
MCH: 28.1 pg (ref 26.0–34.0)
MCHC: 34.2 g/dL (ref 30.0–36.0)
Monocytes Absolute: 0.6 10*3/uL (ref 0.1–1.0)
Monocytes Relative: 7 % (ref 3–12)
Neutro Abs: 6.7 10*3/uL (ref 1.7–7.7)
Neutrophils Relative %: 85 % — ABNORMAL HIGH (ref 43–77)
Platelets: 210 10*3/uL (ref 150–400)

## 2013-04-20 LAB — DIFFERENTIAL
Basophils Absolute: 0 10*3/uL (ref 0.0–0.1)
Basophils Relative: 0 % (ref 0–1)
Monocytes Absolute: 0.1 10*3/uL (ref 0.1–1.0)
Neutro Abs: 9.2 10*3/uL — ABNORMAL HIGH (ref 1.7–7.7)
Neutrophils Relative %: 90 % — ABNORMAL HIGH (ref 43–77)

## 2013-04-20 LAB — COMPREHENSIVE METABOLIC PANEL
ALT: 77 U/L — ABNORMAL HIGH (ref 0–35)
Albumin: 2.8 g/dL — ABNORMAL LOW (ref 3.5–5.2)
BUN: 10 mg/dL (ref 6–23)
Calcium: 7.7 mg/dL — ABNORMAL LOW (ref 8.4–10.5)
Chloride: 104 mEq/L (ref 96–112)
Creatinine, Ser: 0.8 mg/dL (ref 0.50–1.10)
Potassium: 3.2 mEq/L — ABNORMAL LOW (ref 3.5–5.1)
Sodium: 139 mEq/L (ref 135–145)
Total Protein: 6.3 g/dL (ref 6.0–8.3)

## 2013-04-20 LAB — PROTIME-INR
INR: 1.29 (ref 0.00–1.49)
Prothrombin Time: 15.8 seconds — ABNORMAL HIGH (ref 11.6–15.2)

## 2013-04-20 LAB — HEPATITIS PANEL, ACUTE
HCV Ab: NEGATIVE
Hep A IgM: NONREACTIVE

## 2013-04-20 MED ORDER — SODIUM CHLORIDE 0.9 % IV BOLUS (SEPSIS)
1000.0000 mL | Freq: Once | INTRAVENOUS | Status: AC
  Administered 2013-04-20: 1000 mL via INTRAVENOUS

## 2013-04-20 MED ORDER — HYDROCODONE-ACETAMINOPHEN 5-325 MG PO TABS
1.0000 | ORAL_TABLET | Freq: Three times a day (TID) | ORAL | Status: DC | PRN
  Administered 2013-04-20 – 2013-04-21 (×2): 1 via ORAL
  Filled 2013-04-20 (×2): qty 1

## 2013-04-20 MED ORDER — POTASSIUM CHLORIDE CRYS ER 20 MEQ PO TBCR
40.0000 meq | EXTENDED_RELEASE_TABLET | ORAL | Status: AC
  Administered 2013-04-20 (×2): 40 meq via ORAL
  Filled 2013-04-20 (×2): qty 2

## 2013-04-20 MED ORDER — ACETAMINOPHEN 325 MG PO TABS
650.0000 mg | ORAL_TABLET | Freq: Four times a day (QID) | ORAL | Status: DC | PRN
  Administered 2013-04-21: 650 mg via ORAL
  Filled 2013-04-20: qty 2

## 2013-04-20 MED ORDER — ENOXAPARIN SODIUM 40 MG/0.4ML ~~LOC~~ SOLN
40.0000 mg | SUBCUTANEOUS | Status: DC
  Administered 2013-04-20 – 2013-04-21 (×2): 40 mg via SUBCUTANEOUS
  Filled 2013-04-20 (×2): qty 0.4

## 2013-04-20 MED ORDER — FERROUS SULFATE 325 (65 FE) MG PO TABS
325.0000 mg | ORAL_TABLET | Freq: Two times a day (BID) | ORAL | Status: DC
  Administered 2013-04-20 – 2013-04-21 (×4): 325 mg via ORAL
  Filled 2013-04-20 (×6): qty 1

## 2013-04-20 MED ORDER — DEXTROSE 5 % IV SOLN
1.0000 g | Freq: Three times a day (TID) | INTRAVENOUS | Status: DC
  Administered 2013-04-20 – 2013-04-21 (×6): 1 g via INTRAVENOUS
  Filled 2013-04-20 (×8): qty 1

## 2013-04-20 MED ORDER — SODIUM CHLORIDE 0.9 % IV SOLN
INTRAVENOUS | Status: DC
  Administered 2013-04-20: 03:00:00 via INTRAVENOUS

## 2013-04-20 MED ORDER — HYDROCODONE-ACETAMINOPHEN 5-325 MG PO TABS
1.0000 | ORAL_TABLET | Freq: Four times a day (QID) | ORAL | Status: DC | PRN
  Administered 2013-04-20 (×2): 1 via ORAL
  Filled 2013-04-20 (×2): qty 1

## 2013-04-20 MED ORDER — VANCOMYCIN HCL 10 G IV SOLR
2000.0000 mg | Freq: Once | INTRAVENOUS | Status: AC
  Administered 2013-04-20: 2000 mg via INTRAVENOUS
  Filled 2013-04-20: qty 2000

## 2013-04-20 MED ORDER — VANCOMYCIN HCL IN DEXTROSE 1-5 GM/200ML-% IV SOLN
1000.0000 mg | Freq: Two times a day (BID) | INTRAVENOUS | Status: DC
  Administered 2013-04-20 – 2013-04-21 (×3): 1000 mg via INTRAVENOUS
  Filled 2013-04-20 (×4): qty 200

## 2013-04-20 MED ORDER — ONDANSETRON HCL 4 MG/2ML IJ SOLN
4.0000 mg | Freq: Four times a day (QID) | INTRAMUSCULAR | Status: DC | PRN
  Administered 2013-04-20: 4 mg via INTRAVENOUS
  Filled 2013-04-20: qty 2

## 2013-04-20 NOTE — ED Notes (Signed)
The admitting doctor just cancelled the zithromax after approx 50 ml had infused

## 2013-04-20 NOTE — Progress Notes (Signed)
ANTIBIOTIC CONSULT NOTE - INITIAL  Pharmacy Consult for vancomycin Indication: rule out pneumonia and rule out sepsis  Allergies  Allergen Reactions  . Codeine Hives, Nausea Only and Other (See Comments)    headaches  . Morphine And Related Nausea And Vomiting and Other (See Comments)    headaches    Patient Measurements: Height: 5\' 7"  (170.2 cm) Weight: 253 lb (114.76 kg) IBW/kg (Calculated) : 61.6  Vital Signs: Temp: 99.4 F (37.4 C) (11/21 2346) Temp src: Oral (11/21 1911) BP: 94/54 mmHg (11/21 2346) Pulse Rate: 94 (11/21 2346)  Labs:  Recent Labs  04/19/13 1934  WBC 10.6*  HGB 14.2  PLT 233  CREATININE 1.03   Estimated Creatinine Clearance: 89.3 ml/min (by C-G formula based on Cr of 1.03).   Microbiology: No results found for this or any previous visit (from the past 720 hour(s)).  Medical History: Past Medical History  Diagnosis Date  . Hypertension   . Postpartum cardiomyopathy 1990    resolved  . Abnormal uterine bleeding   . Headache(784.0)   . Anemia   . Dysrhythmia 2013     brief episodes of sinus tachycardia- controlled by med    Assessment: 47yo female had dental procedure followed by course of amoxicillin, now c/o weakness x3d, appears septic per admitting MD w/ possible infection on chest CT, to begin broad-spectrum ABX.  Goal of Therapy:  Vancomycin trough level 15-20 mcg/ml  Plan:  Will give vancomycin 2000mg  IV x1 followed by 1000mg  IV Q12h and monitor CBC, Cx, levels prn.  Vernard Gambles, PharmD, BCPS  04/20/2013,12:20 AM

## 2013-04-20 NOTE — ED Notes (Signed)
Temp not elevated.  vicodin not given yet

## 2013-04-20 NOTE — H&P (Signed)
[facility] History and Physical  Patient: [name] [name]  WUJ:[id number]  DOB: Oct 11, 1965  DOS: the patient was seen and examined on 04/19/2013 PCP: Provider Not In System  Chief Complaint: Generalized weakness lethargy and fever and chills  HPI: [name] [name] is a 47 y.o. female with Past medical history of hypertension. The patient is coming from home. The patient mentions that 10 days ago she had undergone removal of 10 teeth. Followed by a she was taking amoxicillin until today. Since last Tuesday she has been having sense of generalized weakness and lethargy associated with malaise and she said that she was coming up with a common cold. She took some Tylenol and Advil but her symptoms were not improving and actually progressing. She has recently traveled from [place] but denies any leg swelling, she initially complained of some chest pain and body ache but denies any pain to me. She is dehydrated and has some nausea. She denies any complaint of diarrhea or burning urination. She had a rash on her right arm which is stable that she noted yesterday. She has a dry cough.  Review of Systems: as mentioned in the history of present illness.  A Comprehensive review of the other systems is negative.  Past Medical History  Diagnosis Date  . Hypertension   . Postpartum cardiomyopathy 1990    resolved  . Abnormal uterine bleeding   . Headache(784.0)   . Anemia   . Dysrhythmia 2013     brief episodes of sinus tachycardia- controlled by med   Past Surgical History  Procedure Laterality Date  . Tubal ligation    . Gallstones    . Abdominal hysterectomy  02/20/2012    Procedure: HYSTERECTOMY ABDOMINAL;  Surgeon: Adam Phenix, MD;  Location: [facility];  Service: Gynecology;  Laterality: N/A;   Social History:  reports that she has never smoked. She does not have any smokeless tobacco history on file. She reports that she does not drink alcohol or use illicit drugs. Independent for  most of her  ADL.  Allergies  Allergen Reactions  . Codeine Hives, Nausea Only and Other (See Comments)    headaches  . Morphine And Related Nausea And Vomiting and Other (See Comments)    headaches    Family History  Problem Relation Age of Onset  . Diabetes Mother   . Hypertension Mother     Prior to Admission medications   Medication Sig Start Date End Date Taking? Authorizing Provider  amoxicillin (AMOXIL) 250 MG capsule Take 250 mg by mouth every 8 (eight) hours.   Yes Historical Provider, MD  chlorothiazide (DIURIL) 250 MG tablet Take 250 mg by mouth every morning.   Yes Historical Provider, MD  ferrous sulfate 325 (65 FE) MG tablet Take 325 mg by mouth 2 (two) times daily with a meal.   Yes Historical Provider, MD  ibuprofen (ADVIL,MOTRIN) 200 MG tablet Take 600 mg by mouth daily as needed. For pain   Yes Historical Provider, MD  LISINOPRIL PO Take 1 tablet by mouth daily.   Yes Historical Provider, MD  HYDROcodone-acetaminophen (NORCO/VICODIN) 5-325 MG per tablet Take 1 tablet by mouth every 6 (six) hours as needed for moderate pain.    Historical Provider, MD    Physical Exam: Filed Vitals:   04/19/13 1911 04/19/13 2030 04/19/13 2202 04/19/13 2346  BP: 135/82  94/44 94/54  Pulse: 142  101 94  Temp: 99.8 F (37.7 C) 101.9 F (38.8 C) 99.6 F (37.6 C) 99.4 F (37.4  C)  TempSrc: Oral     Resp: 22  20 20   Height: 5\' 7"  (1.702 m)     Weight: 114.76 kg (253 lb)     SpO2: 98%  93% 96%    General: Alert, Awake and Oriented to Time, Place and Person. Appear in mild distress Eyes: PERRL ENT: Oral Mucosa clear dry. Submandibular lymphadenopathy more on the right than the left Neck: No JVD Cardiovascular: S1 and S2 Present, no Murmur, Peripheral Pulses Present Respiratory: Bilateral Air entry equal and Decreased, bilateral basal  Crackles more on the left, no wheezes Abdomen: Bowel Sound Present, Soft and Non tender Skin: Right arm diffuse Rash, nonblanching  nonpruritic Extremities: No Pedal edema, no calf tenderness Neurologic: Grossly Unremarkable.  Labs on Admission:  CBC:  Recent Labs Lab 04/19/13 1934  WBC 10.6*  HGB 14.2  HCT 41.1  MCV 82.0  PLT 233    CMP     Component Value Date/Time   NA 133* 04/19/2013 1934   K 3.6 04/19/2013 1934   CL 95* 04/19/2013 1934   CO2 24 04/19/2013 1934   GLUCOSE 112* 04/19/2013 1934   BUN 18 04/19/2013 1934   CREATININE 1.03 04/19/2013 1934   CALCIUM 9.4 04/19/2013 1934   PROT 8.0 04/19/2013 1934   ALBUMIN 3.6 04/19/2013 1934   AST 125* 04/19/2013 1934   ALT 98* 04/19/2013 1934   ALKPHOS 142* 04/19/2013 1934   BILITOT 1.1 04/19/2013 1934   GFRNONAA 64* 04/19/2013 1934   GFRAA 74* 04/19/2013 1934    No results found for this basename: LIPASE, AMYLASE,  in the last 168 hours No results found for this basename: AMMONIA,  in the last 168 hours  No results found for this basename: CKTOTAL, CKMB, CKMBINDEX, TROPONINI,  in the last 168 hours BNP (last 3 results) No results found for this basename: PROBNP,  in the last 8760 hours  Radiological Exams on Admission: Ct Angio Chest Pe W/cm &/or Wo Cm  04/19/2013   CLINICAL DATA:  Chest pain.  EXAM: CT ANGIOGRAPHY CHEST WITH CONTRAST  TECHNIQUE: Multidetector CT imaging of the chest was performed using the standard protocol during bolus administration of intravenous contrast. Multiplanar CT image reconstructions including MIPs were obtained to evaluate the vascular anatomy.  CONTRAST:  80mL OMNIPAQUE IOHEXOL 350 MG/ML SOLN  COMPARISON:  01/02/2011  FINDINGS: THORACIC INLET/BODY WALL:  No acute abnormality.  MEDIASTINUM:  Mild cardiomegaly. No pericardial effusion. No acute vascular abnormality. No adenopathy.  LUNG WINDOWS:  Suggestion of ground-glass centrilobular nodules with diffuse interstitial prominence and patchy ground-glass density. There are numerous somewhat ill-defined pulmonary nodules present bilaterally, with a posterior and inferior  predilection. These are new prior, most measuring 1 cm or smaller. Subpleural fibrotic changes to the posterior right lower lobe and left apex are stable.  UPPER ABDOMEN:  Enlargement of the anterior right pericardial nodes, which may be from the thorax or liver, measuring up to 11 x 13 mm. Porta hepatis gastrohepatic ligament nodes are also prominent. Significance of these nodes to be determined by pulmonary workup.  OSSEOUS:  No acute fracture.  No suspicious lytic or blastic lesions.  Review of the MIP images confirms the above findings.  IMPRESSION: 1. Negative for pulmonary embolism. 2. Multiple small bilateral pulmonary nodules. An infectious or inflammatory process (including granulomatous infection or septic emboli) is favored given there is also interstitial and airspace opacities, but metastatic disease is also a diagnostic possibility.   Electronically Signed   By: Audry Riles.D.  On: 04/19/2013 23:01   Dg Chest Portable 1 View  04/19/2013   CLINICAL DATA:  Chest pain.  Hypertension.  EXAM: PORTABLE CHEST - 1 VIEW  COMPARISON:  11/12/2011  FINDINGS: Mild peribronchial thickening. Heart is borderline in size with vascular congestion. No confluent opacities or effusions. Low lung volumes. No acute bony abnormality.  IMPRESSION: Borderline heart size with vascular congestion and mild bronchitic changes. Low lung volumes.   Electronically Signed   By: Charlett Nose M.D.   On: 04/19/2013 20:21   Assessment/Plan Principal Problem:   Sepsis Active Problems:   Hypertension   Atypical pneumonia   1. Sepsis The patient is presenting with hypotension, tachycardia, generalized weakness and lethargy, fever and possible atypical pneumonia. She has recently undergone a dental extraction and has been taking amoxicillin until today despite which she developed infection. A CT scan of the chest has ruled out pulmonary embolism. At present she has received 2 L of fluid and her blood pressure in 90s I  will continue to hydrate her with IV fluids aggressively, I will broaden her antibiotic coverage to IV vancomycin and IV cefepime considering her recent use of antibiotics and procedure. Blood culture, urine culture, urine antigens, sputum culture will be obtained. She will be admitted to telemetry She has elevated LFT which could reflect current infection but I would also check a hepatitis panel in the morning  2. Hypertension At present holding her antihypertensive medication  DVT Prophylaxis: subcutaneous Heparin Nutrition: As tolerated  Code Status: Full  Family Communication: Significant other was present at bedside, opportunity was given to ask question and all questions were answered satisfactorily at the time of interview. Disposition: Admitted to inpatient in telemetry unit.  Author: Lynden Oxford, MD [facility] Pager: [phone] 04/20/2013, 12:13 AM    If 7PM-7AM, please contact night-coverage [email or web address] Password [email or web address]

## 2013-04-20 NOTE — ED Notes (Signed)
Report called to rn on 4w

## 2013-04-20 NOTE — Progress Notes (Signed)
[facility] PROGRESS NOTE  [name] [name] DOB: January 21, 1966 DOA: 04/19/2013 PCP: Provider Not In System  Assessment/Plan: 1-SIRS/Early sepsis: presumed to be secondary to PNA vs septic emboli. -recent dental extractions -will continue vanc and cefepime -continue IVF's -follow clinical response and cx's  2-HTN: currently hypotensive. -hold antihypertensive agents -continue IVF's -minimize analgesics that can lower BP even more  3-Nausea: due to PNA most likely. -ORN antiemetics  4-Hypokalemia: will replete  5-Hx of iron deficiency anemia due to uterus bleeding: s/p hysterectomy -continue ferrous sulfate -Hgb WNL now.  DVT: heparin    Code Status: Full Family Communication: boyfriend at bedside Disposition Plan: home when medically stable   Consultants:  None   Procedures:  See below for x-ray reports  Antibiotics:  vanc and cefepime   HPI/Subjective: Feeling better. Complaints of nausea.  Objective: Filed Vitals:   04/20/13 0827  BP: 84/60  Pulse:   Temp:   Resp:     Intake/Output Summary (Last 24 hours) at 04/20/13 1353 Last data filed at 04/20/13 0655  Gross per 24 hour  Intake 3581.25 ml  Output    900 ml  Net 2681.25 ml   Filed Weights   04/19/13 1911 04/20/13 0209  Weight: 114.76 kg (253 lb) 115.1 kg (253 lb 12 oz)    Exam:   General:  Feeling better, currently afebrile  Cardiovascular: S1 and S2, no rubs or gallops  Respiratory: good air movement, no wheezing, scattered rhonchi  Abdomen: soft, NT, ND, positive BS  Musculoskeletal: no edema, no cyanosis or clubbing  Data Reviewed: Basic Metabolic Panel:  Recent Labs Lab 04/19/13 1934 04/20/13 0706  NA 133* 139  K 3.6 3.2*  CL 95* 104  CO2 24 24  GLUCOSE 112* 102*  BUN 18 10  CREATININE 1.03 0.80  CALCIUM 9.4 7.7*   Liver Function Tests:  Recent Labs Lab 04/19/13 1934 04/20/13 0706  AST 125* 72*  ALT 98* 77*  ALKPHOS 142* 131*  BILITOT  1.1 1.3*  PROT 8.0 6.3  ALBUMIN 3.6 2.8*   CBC:  Recent Labs Lab 04/19/13 1934 04/20/13 0706  WBC 10.6* 7.9  NEUTROABS 9.2* 6.7  HGB 14.2 12.1  HCT 41.1 35.4*  MCV 82.0 82.3  PLT 233 210     Studies: Ct Angio Chest Pe W/cm &/or Wo Cm  04/19/2013   CLINICAL DATA:  Chest pain.  EXAM: CT ANGIOGRAPHY CHEST WITH CONTRAST  TECHNIQUE: Multidetector CT imaging of the chest was performed using the standard protocol during bolus administration of intravenous contrast. Multiplanar CT image reconstructions including MIPs were obtained to evaluate the vascular anatomy.  CONTRAST:  80mL OMNIPAQUE IOHEXOL 350 MG/ML SOLN  COMPARISON:  01/02/2011  FINDINGS: THORACIC INLET/BODY WALL:  No acute abnormality.  MEDIASTINUM:  Mild cardiomegaly. No pericardial effusion. No acute vascular abnormality. No adenopathy.  LUNG WINDOWS:  Suggestion of ground-glass centrilobular nodules with diffuse interstitial prominence and patchy ground-glass density. There are numerous somewhat ill-defined pulmonary nodules present bilaterally, with a posterior and inferior predilection. These are new prior, most measuring 1 cm or smaller. Subpleural fibrotic changes to the posterior right lower lobe and left apex are stable.  UPPER ABDOMEN:  Enlargement of the anterior right pericardial nodes, which may be from the thorax or liver, measuring up to 11 x 13 mm. Porta hepatis gastrohepatic ligament nodes are also prominent. Significance of these nodes to be determined by pulmonary workup.  OSSEOUS:  No acute fracture.  No suspicious lytic or blastic lesions.  Review of the MIP  images confirms the above findings.  IMPRESSION: 1. Negative for pulmonary embolism. 2. Multiple small bilateral pulmonary nodules. An infectious or inflammatory process (including granulomatous infection or septic emboli) is favored given there is also interstitial and airspace opacities, but metastatic disease is also a diagnostic possibility.   Electronically  Signed   By: Tiburcio Pea M.D.   On: 04/19/2013 23:01   Dg Chest Portable 1 View  04/19/2013   CLINICAL DATA:  Chest pain.  Hypertension.  EXAM: PORTABLE CHEST - 1 VIEW  COMPARISON:  11/12/2011  FINDINGS: Mild peribronchial thickening. Heart is borderline in size with vascular congestion. No confluent opacities or effusions. Low lung volumes. No acute bony abnormality.  IMPRESSION: Borderline heart size with vascular congestion and mild bronchitic changes. Low lung volumes.   Electronically Signed   By: Charlett Nose M.D.   On: 04/19/2013 20:21    Scheduled Meds: . ceFEPime (MAXIPIME) IV  1 g Intravenous Q8H  . enoxaparin (LOVENOX) injection  40 mg Subcutaneous Q24H  . ferrous sulfate  325 mg Oral BID WC  . potassium chloride  40 mEq Oral Q4H  . vancomycin  1,000 mg Intravenous Q12H   Continuous Infusions: . sodium chloride 125 mL/hr at 04/20/13 0240    Time spent: >30 minutes   [name] [name]  [facility] Pager [phone]. If 7PM-7AM, please contact night-coverage at [email or web address], password [email or web address] 04/20/2013, 1:53 PM  LOS: 1 day

## 2013-04-20 NOTE — Progress Notes (Signed)
Patient's blood pressure 82/52 manually.  Pt. Asymptomatic.  Elray Mcgregor, PA on call made aware.  New orders received.  Will continue to monitor patient.

## 2013-04-20 NOTE — ED Notes (Signed)
Pt up to the br.  Admitting doctor has just seen pt

## 2013-04-20 NOTE — ED Notes (Signed)
[name] [name] has no come up from the pharmacy

## 2013-04-21 DIAGNOSIS — R110 Nausea: Secondary | ICD-10-CM

## 2013-04-21 DIAGNOSIS — I10 Essential (primary) hypertension: Secondary | ICD-10-CM

## 2013-04-21 DIAGNOSIS — J189 Pneumonia, unspecified organism: Secondary | ICD-10-CM

## 2013-04-21 DIAGNOSIS — R6510 Systemic inflammatory response syndrome (SIRS) of non-infectious origin without acute organ dysfunction: Secondary | ICD-10-CM

## 2013-04-21 LAB — BASIC METABOLIC PANEL
CO2: 25 mEq/L (ref 19–32)
Calcium: 8.2 mg/dL — ABNORMAL LOW (ref 8.4–10.5)
Chloride: 104 mEq/L (ref 96–112)
GFR calc Af Amer: >90 mL/min (ref >90)
Potassium: 3.6 mEq/L (ref 3.5–5.1)

## 2013-04-21 LAB — LEGIONELLA ANTIGEN, URINE: Legionella Antigen, Urine: NEGATIVE

## 2013-04-21 LAB — URINE CULTURE
Colony Count: NO GROWTH
Culture: NO GROWTH

## 2013-04-21 MED ORDER — CHLOROTHIAZIDE 250 MG PO TABS: 250.0000 mg | ORAL_TABLET | ORAL | Status: DC

## 2013-04-21 MED ORDER — LISINOPRIL 5 MG PO TABS: 5.0000 mg | ORAL_TABLET | Freq: Every day | ORAL | Status: DC

## 2013-04-21 MED ORDER — DOXYCYCLINE HYCLATE 100 MG PO TABS: 100.0000 mg | ORAL_TABLET | Freq: Two times a day (BID) | ORAL | Status: AC

## 2013-04-21 NOTE — Discharge Summary (Signed)
Physician Discharge Summary  [name] [name] WUJ:[id number] DOB: 01-17-1966 DOA: 04/19/2013  PCP: Provider Not In System  Admit date: 04/19/2013 Discharge date: 04/21/2013  Time spent:  >30 minutes  Recommendations for Outpatient Follow-up:  1. CMET to follow electrolytes, renal function and LFT's 2. Reassess BP and adjust medications as needed 3. Repeat in 4 weeks CXR/CT to follow resolution of PNA/inflammatory nodules.  Discharge Diagnoses:  SIRS/early Sepsis Hypertension Atypical pneumonia transaminitis Nausea Hypokalemia History of iron deficiency anemia   Discharge Condition: stable and improved. Discharge home with instructions to take medications as prescribed And to follow with PCP in 1 week.  Diet recommendation: heart healthy diet  Filed Weights   04/19/13 1911 04/20/13 0209  Weight: 114.76 kg (253 lb) 115.1 kg (253 lb 12 oz)    History of present illness:  47 y.o. female with Past medical history of hypertension.  The patient is coming from home. The patient mentions that 10 days ago she had undergone removal of 10 teeth. Followed by a she was taking amoxicillin until today. Since last Tuesday she has been having sense of generalized weakness and lethargy associated with malaise and she said that she was coming up with a common cold. She took some Tylenol and Advil but her symptoms were not improving and actually progressing. She has recently traveled from [place] but denies any leg swelling, she initially complained of some chest pain and body ache but denies any pain to me. She is dehydrated and has some nausea.  She denies any complaint of diarrhea or burning urination. She had a rash on her right arm which is stable that she noted yesterday.  She has a dry cough.   Hospital Course:  1-SIRS/Early sepsis: presumed to be secondary to atypical PNA.  -recent dental extractions. -symptoms improved and patient after 48 hours of IV antibiotics is  asymptomatic. -antibiotics has been switch to doxycycline 100 mg BID for 8 more days to complete treatment CXR/CT chest to repeated in 4 weeks to follow resolution -patient advised to keep herself hydrated and will follow with PCP in 1 week  2-HTN: stable and well controlled after IVF's resuscitation  -hold antihypertensive agents until 11/25 -minimize analgesics that can lower BP even more  -advise to follow lo sodium diet and to keep herself hydrated  3-Nausea: due to PNA most likely.  -received PRN antiemetics  -no further nausea or vomiting -tolerating PO meds and diet at discharge  4-Hypokalemia: repleted  5-Hx of iron deficiency anemia due to uterus bleeding: s/p hysterectomy  -continue ferrous sulfate  -Hgb WNL now.  6-transaminitis: appears to be 2/2 shock liver with hypotension. -hepatitis panel neg -LFT's trending down and improving.  *Rest of medical problems remains stable and the plan is to continue current medication regimen and to follow with PCP for further medication adjustments.   Procedures: See below for x-ray reports  Consultations:  None  Discharge Exam: Filed Vitals:   04/21/13 1447  BP: 126/73  Pulse: 68  Temp: 97.5 F (36.4 C)  Resp: 19   General: Feeling better, currently afebrile  Cardiovascular: S1 and S2, no rubs or gallops  Respiratory: good air movement, no wheezing, scattered rhonchi  Abdomen: soft, NT, ND, positive BS  Musculoskeletal: no edema, no cyanosis or clubbing   Discharge Instructions  Discharge Orders   Future Orders Complete By Expires   Discharge instructions  As directed    Comments:     Take medications as prescribed Keep yourself well hydrated Follow with PCP  in 1 week Resume antihypertensive drugs as prescribed on 04/23/13 Follow a low sodium diet       Medication List    STOP taking these medications       amoxicillin 250 MG capsule  Commonly known as:  AMOXIL      TAKE these medications        chlorothiazide 250 MG tablet  Commonly known as:  DIURIL  Take 1 tablet (250 mg total) by mouth every morning. Do not resume until 11/25  Start taking on:  04/23/2013     doxycycline 100 MG tablet  Commonly known as:  VIBRA-TABS  Take 1 tablet (100 mg total) by mouth 2 (two) times daily.     ferrous sulfate 325 (65 FE) MG tablet  Take 325 mg by mouth 2 (two) times daily with a meal.     HYDROcodone-acetaminophen 5-325 MG per tablet  Commonly known as:  NORCO/VICODIN  Take 1 tablet by mouth every 6 (six) hours as needed for moderate pain.     ibuprofen 200 MG tablet  Commonly known as:  ADVIL,MOTRIN  Take 600 mg by mouth daily as needed. For pain     lisinopril 5 MG tablet  Commonly known as:  PRINIVIL,ZESTRIL  Take 1 tablet (5 mg total) by mouth daily.  Start taking on:  04/23/2013       Allergies  Allergen Reactions  . Codeine Hives, Nausea Only and Other (See Comments)    headaches  . Morphine And Related Nausea And Vomiting and Other (See Comments)    headaches    The results of significant diagnostics from this hospitalization (including imaging, microbiology, ancillary and laboratory) are listed below for reference.    Significant Diagnostic Studies: Ct Angio Chest Pe W/cm &/or Wo Cm  04/19/2013   CLINICAL DATA:  Chest pain.  EXAM: CT ANGIOGRAPHY CHEST WITH CONTRAST  TECHNIQUE: Multidetector CT imaging of the chest was performed using the standard protocol during bolus administration of intravenous contrast. Multiplanar CT image reconstructions including MIPs were obtained to evaluate the vascular anatomy.  CONTRAST:  80mL OMNIPAQUE IOHEXOL 350 MG/ML SOLN  COMPARISON:  01/02/2011  FINDINGS: THORACIC INLET/BODY WALL:  No acute abnormality.  MEDIASTINUM:  Mild cardiomegaly. No pericardial effusion. No acute vascular abnormality. No adenopathy.  LUNG WINDOWS:  Suggestion of ground-glass centrilobular nodules with diffuse interstitial prominence and patchy ground-glass  density. There are numerous somewhat ill-defined pulmonary nodules present bilaterally, with a posterior and inferior predilection. These are new prior, most measuring 1 cm or smaller. Subpleural fibrotic changes to the posterior right lower lobe and left apex are stable.  UPPER ABDOMEN:  Enlargement of the anterior right pericardial nodes, which may be from the thorax or liver, measuring up to 11 x 13 mm. Porta hepatis gastrohepatic ligament nodes are also prominent. Significance of these nodes to be determined by pulmonary workup.  OSSEOUS:  No acute fracture.  No suspicious lytic or blastic lesions.  Review of the MIP images confirms the above findings.  IMPRESSION: 1. Negative for pulmonary embolism. 2. Multiple small bilateral pulmonary nodules. An infectious or inflammatory process (including granulomatous infection or septic emboli) is favored given there is also interstitial and airspace opacities, but metastatic disease is also a diagnostic possibility.   Electronically Signed   By: Tiburcio Pea M.D.   On: 04/19/2013 23:01   Dg Chest Portable 1 View  04/19/2013   CLINICAL DATA:  Chest pain.  Hypertension.  EXAM: PORTABLE CHEST - 1 VIEW  COMPARISON:  11/12/2011  FINDINGS: Mild peribronchial thickening. Heart is borderline in size with vascular congestion. No confluent opacities or effusions. Low lung volumes. No acute bony abnormality.  IMPRESSION: Borderline heart size with vascular congestion and mild bronchitic changes. Low lung volumes.   Electronically Signed   By: Charlett Nose M.D.   On: 04/19/2013 20:21    Microbiology: Recent Results (from the past 240 hour(s))  CULTURE, BLOOD (ROUTINE X 2)     Status: None   Collection Time    04/19/13 11:28 PM      Result Value Range Status   Specimen Description BLOOD LEFT ARM   Final   Special Requests BOTTLES DRAWN AEROBIC ONLY 8CC   Final   Culture  Setup Time     Final   Value: 04/20/2013 04:46     Performed at [facility]    Culture     Final   Value:        BLOOD CULTURE RECEIVED NO GROWTH TO DATE CULTURE WILL BE HELD FOR 5 DAYS BEFORE ISSUING A FINAL NEGATIVE REPORT     Performed at [facility]   Report Status PENDING   Incomplete  CULTURE, BLOOD (ROUTINE X 2)     Status: None   Collection Time    04/19/13 11:34 PM      Result Value Range Status   Specimen Description BLOOD LEFT HAND   Final   Special Requests BOTTLES DRAWN AEROBIC AND ANAEROBIC 10CC   Final   Culture  Setup Time     Final   Value: 04/20/2013 05:41     Performed at [facility]   Culture     Final   Value:        BLOOD CULTURE RECEIVED NO GROWTH TO DATE CULTURE WILL BE HELD FOR 5 DAYS BEFORE ISSUING A FINAL NEGATIVE REPORT     Performed at [facility]   Report Status PENDING   Incomplete     Labs: Basic Metabolic Panel:  Recent Labs Lab 04/19/13 1934 04/20/13 0706 04/21/13 0615  NA 133* 139 139  K 3.6 3.2* 3.6  CL 95* 104 104  CO2 24 24 25   GLUCOSE 112* 102* 95  BUN 18 10 7   CREATININE 1.03 0.80 0.84  CALCIUM 9.4 7.7* 8.2*   Liver Function Tests:  Recent Labs Lab 04/19/13 1934 04/20/13 0706  AST 125* 72*  ALT 98* 77*  ALKPHOS 142* 131*  BILITOT 1.1 1.3*  PROT 8.0 6.3  ALBUMIN 3.6 2.8*   CBC:  Recent Labs Lab 04/19/13 1934 04/20/13 0706  WBC 10.6* 7.9  NEUTROABS 9.2* 6.7  HGB 14.2 12.1  HCT 41.1 35.4*  MCV 82.0 82.3  PLT 233 210    Signed:  Burr Soffer  [facility] 04/21/2013, 3:07 PM

## 2013-04-21 NOTE — Progress Notes (Signed)
Patient alert oriented , vital sign stable , iv d/c, d/c instruction given, pt. Verbalized understanding. Pt. D/c per order Burr Medico RN

## 2013-04-26 LAB — CULTURE, BLOOD (ROUTINE X 2)
Culture: NO GROWTH
Culture: NO GROWTH

## 2013-11-15 IMAGING — CR DG CHEST 1V PORT
1 series · 1 of 1 positions shown · non-contrast
Comparison: 01/02/2011; 07/25/2006; chest CT - 01/02/2011

CLINICAL DATA: Chest pain, shortness of breath

PORTABLE CHEST - 1 VIEW

[AP]
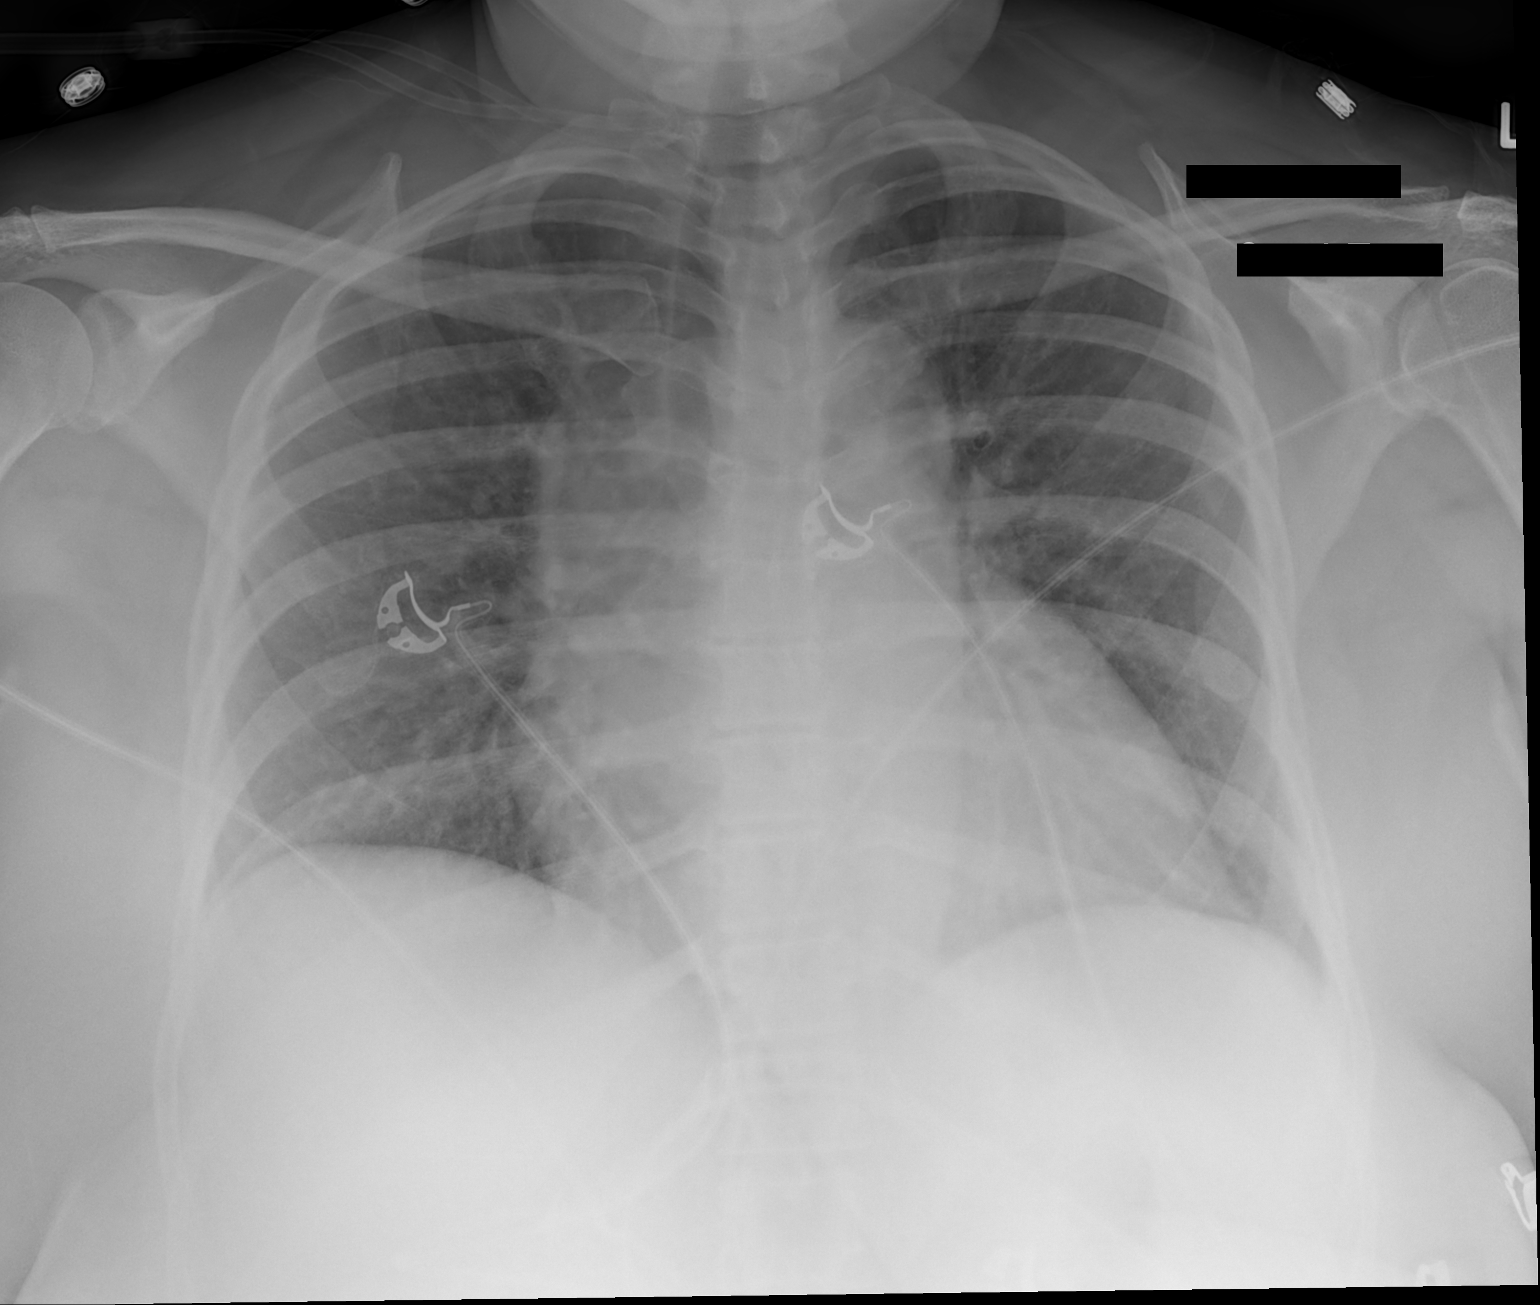

[1 of 1 positions shown; findings below may reference images not displayed]

FINDINGS: Grossly unchanged enlarged cardiac silhouette and
mediastinal contours.  No focal airspace opacities.  No pleural
effusion or pneumothorax.  Grossly unchanged bones.
IMPRESSION: No acute cardiopulmonary disease on this portable AP examination

## 2014-03-31 ENCOUNTER — Encounter (HOSPITAL_COMMUNITY): Payer: Self-pay | Admitting: Nurse Practitioner

## 2014-12-10 ENCOUNTER — Other Ambulatory Visit: Payer: Self-pay | Admitting: Family Medicine

## 2014-12-10 ENCOUNTER — Other Ambulatory Visit: Payer: Self-pay

## 2014-12-10 ENCOUNTER — Other Ambulatory Visit: Payer: Self-pay | Admitting: Nurse Practitioner

## 2014-12-10 DIAGNOSIS — Z1231 Encounter for screening mammogram for malignant neoplasm of breast: Secondary | ICD-10-CM

## 2014-12-11 ENCOUNTER — Ambulatory Visit
Admission: RE | Admit: 2014-12-11 | Discharge: 2014-12-11 | Disposition: A | Discharge status: Home / Self Care | Payer: 59 | Source: Ambulatory Visit | Attending: Family Medicine | Admitting: Family Medicine

## 2014-12-11 DIAGNOSIS — Z1231 Encounter for screening mammogram for malignant neoplasm of breast: Secondary | ICD-10-CM

## 2014-12-30 ENCOUNTER — Other Ambulatory Visit: Payer: Self-pay | Admitting: Nurse Practitioner

## 2014-12-30 DIAGNOSIS — L299 Pruritus, unspecified: Secondary | ICD-10-CM

## 2014-12-30 DIAGNOSIS — R609 Edema, unspecified: Secondary | ICD-10-CM

## 2015-01-02 ENCOUNTER — Other Ambulatory Visit: Payer: Self-pay | Admitting: Nurse Practitioner

## 2015-01-02 ENCOUNTER — Ambulatory Visit
Admission: RE | Admit: 2015-01-02 | Discharge: 2015-01-02 | Disposition: A | Discharge status: Home / Self Care | Payer: 59 | Source: Ambulatory Visit | Attending: Nurse Practitioner | Admitting: Nurse Practitioner

## 2015-01-02 DIAGNOSIS — R609 Edema, unspecified: Secondary | ICD-10-CM

## 2015-01-02 DIAGNOSIS — L299 Pruritus, unspecified: Secondary | ICD-10-CM

## 2016-02-13 ENCOUNTER — Encounter (HOSPITAL_COMMUNITY): Payer: Self-pay | Admitting: Emergency Medicine

## 2016-02-13 ENCOUNTER — Emergency Department (HOSPITAL_COMMUNITY)
Admission: EM | Admit: 2016-02-13 | Discharge: 2016-02-13 | Disposition: A | Discharge status: Home / Self Care | Payer: 59 | Attending: Emergency Medicine | Admitting: Emergency Medicine

## 2016-02-13 ENCOUNTER — Emergency Department (HOSPITAL_COMMUNITY): Payer: 59

## 2016-02-13 DIAGNOSIS — Z79899 Other long term (current) drug therapy: Secondary | ICD-10-CM | POA: Insufficient documentation

## 2016-02-13 DIAGNOSIS — R0602 Shortness of breath: Secondary | ICD-10-CM | POA: Diagnosis not present

## 2016-02-13 DIAGNOSIS — I10 Essential (primary) hypertension: Secondary | ICD-10-CM | POA: Insufficient documentation

## 2016-02-13 DIAGNOSIS — R1013 Epigastric pain: Secondary | ICD-10-CM | POA: Diagnosis not present

## 2016-02-13 DIAGNOSIS — R0789 Other chest pain: Secondary | ICD-10-CM | POA: Diagnosis present

## 2016-02-13 LAB — BASIC METABOLIC PANEL
ANION GAP: 9 (ref 5–15)
BUN: 18 mg/dL (ref 6–20)
CO2: 29 mmol/L (ref 22–32)
Calcium: 9.9 mg/dL (ref 8.9–10.3)
Chloride: 99 mmol/L — ABNORMAL LOW (ref 101–111)
Creatinine, Ser: 0.96 mg/dL (ref 0.44–1.00)
Glucose, Bld: 89 mg/dL (ref 65–99)
Potassium: 3.6 mmol/L (ref 3.5–5.1)
Sodium: 137 mmol/L (ref 135–145)

## 2016-02-13 LAB — CBC
HCT: 42.9 % (ref 36.0–46.0)
HEMOGLOBIN: 14.2 g/dL (ref 12.0–15.0)
MCH: 27.6 pg (ref 26.0–34.0)
MCHC: 33.1 g/dL (ref 30.0–36.0)
MCV: 83.5 fL (ref 78.0–100.0)
Platelets: 309 10*3/uL (ref 150–400)
RBC: 5.14 MIL/uL — ABNORMAL HIGH (ref 3.87–5.11)
RDW: 13.5 % (ref 11.5–15.5)
WBC: 6.1 10*3/uL (ref 4.0–10.5)

## 2016-02-13 LAB — I-STAT TROPONIN, ED
TROPONIN I, POC: 0 ng/mL (ref 0.00–0.08)
Troponin i, poc: 0.01 ng/mL (ref 0.00–0.08)

## 2016-02-13 MED ORDER — GI COCKTAIL ~~LOC~~
30.0000 mL | Freq: Once | ORAL | Status: AC
  Administered 2016-02-13: 30 mL via ORAL
  Filled 2016-02-13: qty 30

## 2016-02-13 NOTE — ED Triage Notes (Signed)
Pt c/o chest pressure, pain in left arm, left neck, left shoulder, nausea, SOB, palpitations onset Wednesday while standing and talking. Worse with any exertion including ambulation.

## 2016-02-13 NOTE — ED Provider Notes (Addendum)
[facility] DEPT Provider Note   CSN: [id number] Arrival date & time: 02/13/16  1528     History   Chief Complaint Chief Complaint  Patient presents with  . Shortness of Breath  . Chest Pain    HPI [name] [name] is a 50 y.o. female.  50 yo F with a chief complaint of shortness of breath. This been going on for the past week. Worse with exertion and better with rest. Patient has some diffuse chest pain as well and goes to bilateral ribs. Describes as sharp. Worse with movement palpation.   The history is provided by the patient.  Shortness of Breath  This is a new problem. The average episode lasts 1 week. The problem occurs continuously.The current episode started less than 1 hour ago. The problem has not changed since onset.Associated symptoms include chest pain. Pertinent negatives include no fever, no headaches, no rhinorrhea, no wheezing and no vomiting. She has tried nothing for the symptoms. The treatment provided no relief.  Chest Pain   Associated symptoms include shortness of breath. Pertinent negatives include no dizziness, no fever, no headaches, no nausea, no palpitations and no vomiting.    Past Medical History:  Diagnosis Date  . Abnormal uterine bleeding   . Anemia   . Dysrhythmia 2013    brief episodes of sinus tachycardia- controlled by med  . Headache(784.0)   . Hypertension   . Postpartum cardiomyopathy 1990   resolved    Patient Active Problem List   Diagnosis Date Noted  . Sepsis ([place]) 04/20/2013  . Atypical pneumonia 04/20/2013  . Vertigo 02/29/2012  . Leiomyoma of uterus, unspecified 11/16/2011  . Metrorrhagia 11/16/2011  . Acute blood loss anemia 11/16/2011  . Hypertension 11/16/2011  . Morbid obesity ([place]) 11/16/2011    Past Surgical History:  Procedure Laterality Date  . ABDOMINAL HYSTERECTOMY  02/20/2012   Procedure: HYSTERECTOMY ABDOMINAL;  Surgeon: Woodroe Mode, MD;  Location: [facility];  Service: Gynecology;  Laterality: N/A;  .  gallstones    . MULTIPLE TOOTH EXTRACTIONS    . TUBAL LIGATION      OB History    Gravida Para Term Preterm AB Living   2 2       2    SAB TAB Ectopic Multiple Live Births                   Home Medications    Prior to Admission medications   Medication Sig Start Date End Date Taking? Authorizing Provider  chlorothiazide (DIURIL) 250 MG tablet Take 1 tablet (250 mg total) by mouth every morning. Do not resume until 11/25 04/23/13   Barton Dubois, MD  ferrous sulfate 325 (65 FE) MG tablet Take 325 mg by mouth 2 (two) times daily with a meal.    Historical Provider, MD  HYDROcodone-acetaminophen (NORCO/VICODIN) 5-325 MG per tablet Take 1 tablet by mouth every 6 (six) hours as needed for moderate pain.    Historical Provider, MD  ibuprofen (ADVIL,MOTRIN) 200 MG tablet Take 600 mg by mouth daily as needed. For pain    Historical Provider, MD  lisinopril (PRINIVIL,ZESTRIL) 5 MG tablet Take 1 tablet (5 mg total) by mouth daily. 04/23/13   Barton Dubois, MD    Family History Family History  Problem Relation Age of Onset  . Diabetes Mother   . Hypertension Mother     Social History Social History  Substance Use Topics  . Smoking status: Never Smoker  . Smokeless tobacco: Not on file  .  Alcohol use No     Allergies   Codeine and Morphine and related   Review of Systems Review of Systems  Constitutional: Negative for chills and fever.  HENT: Negative for congestion and rhinorrhea.   Eyes: Negative for redness and visual disturbance.  Respiratory: Positive for shortness of breath. Negative for wheezing.   Cardiovascular: Positive for chest pain. Negative for palpitations.  Gastrointestinal: Negative for nausea and vomiting.  Genitourinary: Negative for dysuria and urgency.  Musculoskeletal: Negative for arthralgias and myalgias.  Skin: Negative for pallor and wound.  Neurological: Negative for dizziness and headaches.     Physical Exam Updated Vital Signs BP 131/85  (BP Location: Left Arm)   Pulse 71   Temp 98.5 F (36.9 C) (Oral)   Resp 17   LMP 02/20/2012   SpO2 99%   Physical Exam  Constitutional: She is oriented to person, place, and time. She appears well-developed and well-nourished. No distress.  HENT:  Head: Normocephalic and atraumatic.  Eyes: EOM are normal. Pupils are equal, round, and reactive to light.  Neck: Normal range of motion. Neck supple.  Cardiovascular: Normal rate and regular rhythm.  Exam reveals no gallop and no friction rub.   No murmur heard. Pulmonary/Chest: Effort normal. She has no wheezes. She has no rales. She exhibits tenderness.  Abdominal: Soft. She exhibits no distension and no mass. There is tenderness (epigastric). There is no guarding.  Musculoskeletal: She exhibits no edema or tenderness.  Neurological: She is alert and oriented to person, place, and time.  Skin: Skin is warm and dry. She is not diaphoretic.  Psychiatric: She has a normal mood and affect. Her behavior is normal.  Nursing note and vitals reviewed.    ED Treatments / Results  Labs (all labs ordered are listed, but only abnormal results are displayed) Labs Reviewed  BASIC METABOLIC PANEL - Abnormal; Notable for the following:       Result Value   Chloride 99 (*)    All other components within normal limits  CBC - Abnormal; Notable for the following:    RBC 5.14 (*)    All other components within normal limits  I-STAT TROPOININ, ED  I-STAT TROPOININ, ED    EKG  EKG Interpretation  Date/Time:  Saturday February 13 2016 15:37:25 EDT Ventricular Rate:  86 PR Interval:    QRS Duration: 103 QT Interval:  383 QTC Calculation: 459 R Axis:   9 Text Interpretation:  Sinus rhythm Probable left ventricular hypertrophy Borderline T abnormalities, anterior leads No significant change since last tracing Confirmed by Alexcia Schools MD, DANIEL [id number]) on 02/13/2016 6:03:49 PM       Radiology Dg Chest 2 View  Result Date: 02/13/2016 CLINICAL  DATA:  Patient with mid chest pain and pressure. Shortness of breath for 4 days. EXAM: CHEST  2 VIEW COMPARISON:  Chest radiograph 04/19/2013 FINDINGS: The heart size and mediastinal contours are within normal limits. Both lungs are clear. The visualized skeletal structures are unremarkable. IMPRESSION: No active cardiopulmonary disease. Electronically Signed   By: Lovey Newcomer M.D.   On: 02/13/2016 16:41    Procedures Procedures (including critical care time)  Medications Ordered in ED Medications  gi cocktail (Maalox,Lidocaine,Donnatal) (30 mLs Oral Given 02/13/16 1903)     Initial Impression / Assessment and Plan / ED Course  I have reviewed the triage vital signs and the nursing notes.  Pertinent labs & imaging results that were available during my care of the patient were reviewed by me and  considered in my medical decision making (see chart for details).  Clinical Course    50 yo F With a chief complaints of chest pain and shortness of breath on exertion. Symptoms are atypical in nature and reproduced with palpation. Delta troponin is negative. Discharge home.  8:41 PM:  I have discussed the diagnosis/risks/treatment options with the patient and family and believe the pt to be eligible for discharge home to follow-up with PCP. We also discussed returning to the ED immediately if new or worsening sx occur. We discussed the sx which are most concerning (e.g., sudden worsening pain, fever, inability to tolerate by mouth) that necessitate immediate return. Medications administered to the patient during their visit and any new prescriptions provided to the patient are listed below.  Medications given during this visit Medications  gi cocktail (Maalox,Lidocaine,Donnatal) (30 mLs Oral Given 02/13/16 1903)     The patient appears reasonably screen and/or stabilized for discharge and I doubt any other medical condition or other [facility] requiring further screening, evaluation, or treatment in the  ED at this time prior to discharge.    Final Clinical Impressions(s) / ED Diagnoses   Final diagnoses:  Atypical chest pain    New Prescriptions New Prescriptions   No medications on file     Deno Etienne, DO 02/13/16 [place], DO 02/13/16 2124

## 2016-02-13 NOTE — Discharge Instructions (Signed)
Try zantac 150mg twice a day.  ° ° °

## 2016-11-04 ENCOUNTER — Encounter (HOSPITAL_COMMUNITY): Payer: Self-pay

## 2016-11-04 ENCOUNTER — Emergency Department (HOSPITAL_COMMUNITY): Payer: 59

## 2016-11-04 ENCOUNTER — Emergency Department (HOSPITAL_COMMUNITY)
Admission: EM | Admit: 2016-11-04 | Discharge: 2016-11-04 | Disposition: A | Discharge status: Home / Self Care | Payer: 59 | Attending: Emergency Medicine | Admitting: Emergency Medicine

## 2016-11-04 DIAGNOSIS — Z79899 Other long term (current) drug therapy: Secondary | ICD-10-CM | POA: Diagnosis not present

## 2016-11-04 DIAGNOSIS — I10 Essential (primary) hypertension: Secondary | ICD-10-CM | POA: Diagnosis not present

## 2016-11-04 DIAGNOSIS — R0602 Shortness of breath: Secondary | ICD-10-CM | POA: Diagnosis not present

## 2016-11-04 DIAGNOSIS — R079 Chest pain, unspecified: Secondary | ICD-10-CM | POA: Diagnosis present

## 2016-11-04 DIAGNOSIS — R0789 Other chest pain: Secondary | ICD-10-CM | POA: Insufficient documentation

## 2016-11-04 LAB — CBC
HCT: 41.9 % (ref 36.0–46.0)
HEMOGLOBIN: 14.3 g/dL (ref 12.0–15.0)
MCH: 28 pg (ref 26.0–34.0)
MCHC: 34.1 g/dL (ref 30.0–36.0)
MCV: 82.2 fL (ref 78.0–100.0)
Platelets: 269 10*3/uL (ref 150–400)
RBC: 5.1 MIL/uL (ref 3.87–5.11)
RDW: 13.6 % (ref 11.5–15.5)
WBC: 6.5 10*3/uL (ref 4.0–10.5)

## 2016-11-04 LAB — COMPREHENSIVE METABOLIC PANEL
ALT: 33 U/L (ref 14–54)
ANION GAP: 12 (ref 5–15)
AST: 36 U/L (ref 15–41)
Albumin: 4.1 g/dL (ref 3.5–5.0)
Alkaline Phosphatase: 67 U/L (ref 38–126)
BUN: 15 mg/dL (ref 6–20)
CO2: 24 mmol/L (ref 22–32)
CREATININE: 0.76 mg/dL (ref 0.44–1.00)
Calcium: 9 mg/dL (ref 8.9–10.3)
Chloride: 101 mmol/L (ref 101–111)
GFR calc Af Amer: >60 mL/min (ref >60)
GFR calc non Af Amer: >60 mL/min (ref >60)
Glucose, Bld: 117 mg/dL — ABNORMAL HIGH (ref 65–99)
Potassium: 3.2 mmol/L — ABNORMAL LOW (ref 3.5–5.1)
SODIUM: 137 mmol/L (ref 135–145)
Total Bilirubin: 1.1 mg/dL (ref 0.3–1.2)
Total Protein: 7.5 g/dL (ref 6.5–8.1)

## 2016-11-04 LAB — TROPONIN I

## 2016-11-04 LAB — D-DIMER, QUANTITATIVE (NOT AT ARMC): D DIMER QUANT: 0.69 ug{FEU}/mL — AB (ref 0.00–0.50)

## 2016-11-04 LAB — BRAIN NATRIURETIC PEPTIDE: B NATRIURETIC PEPTIDE 5: 54.8 pg/mL (ref 0.0–100.0)

## 2016-11-04 LAB — LIPASE, BLOOD: Lipase: 27 U/L (ref 11–51)

## 2016-11-04 MED ORDER — IOPAMIDOL (ISOVUE-370) INJECTION 76%
INTRAVENOUS | Status: AC
  Filled 2016-11-04: qty 100

## 2016-11-04 MED ORDER — IOPAMIDOL (ISOVUE-370) INJECTION 76%
100.0000 mL | Freq: Once | INTRAVENOUS | Status: AC | PRN
  Administered 2016-11-04: 73 mL via INTRAVENOUS

## 2016-11-04 MED ORDER — DIAZEPAM 2 MG PO TABS: 2.0000 mg | ORAL_TABLET | Freq: Four times a day (QID) | ORAL | 0 refills | Status: DC | PRN

## 2016-11-04 NOTE — ED Triage Notes (Signed)
Patient c/o having N/V/D and dizziness 3 days ago.patient states she began having intermittent SOB and intermittent left chest pain that radiated down the left arm at 0500 today. Patient states she currently is not having CP.

## 2016-11-04 NOTE — ED Provider Notes (Signed)
[facility] DEPT Provider Note   CSN: [id number] Arrival date & time: 11/04/16  0730     History   Chief Complaint Chief Complaint  Patient presents with  . Chest Pain  . Shortness of Breath    HPI Ela Moffat is a 51 y.o. female.  51 year old female who presents with two-day history of nausea vomiting diarrhea. Stated that she had emesis with some upper abdominal pain since resolved. Had some watery diarrhea does not resolve as well. No fever or chills. Patient awoke this morning with having left-sided chest discomfort which lasted for approximately 30 minutes. Has had this before in the past when she has had issues with her cardiomyopathy. Denies any lower extremity edema. Does note some increased dyspnea on exertion. No recent fever or chills. No cough or congestion. Symptoms resolved on their own.      Past Medical History:  Diagnosis Date  . Abnormal uterine bleeding   . Anemia   . Dysrhythmia 2013    brief episodes of sinus tachycardia- controlled by med  . Headache(784.0)   . Hypertension   . Postpartum cardiomyopathy 1990   resolved    Patient Active Problem List   Diagnosis Date Noted  . Sepsis ([place]) 04/20/2013  . Atypical pneumonia 04/20/2013  . Vertigo 02/29/2012  . Leiomyoma of uterus, unspecified 11/16/2011  . Metrorrhagia 11/16/2011  . Acute blood loss anemia 11/16/2011  . Hypertension 11/16/2011  . Morbid obesity ([place]) 11/16/2011    Past Surgical History:  Procedure Laterality Date  . ABDOMINAL HYSTERECTOMY  02/20/2012   Procedure: HYSTERECTOMY ABDOMINAL;  Surgeon: Woodroe Mode, MD;  Location: [facility];  Service: Gynecology;  Laterality: N/A;  . gallstones    . MULTIPLE TOOTH EXTRACTIONS    . TUBAL LIGATION      OB History    Gravida Para Term Preterm AB Living   2 2       2    SAB TAB Ectopic Multiple Live Births                   Home Medications    Prior to Admission medications   Medication Sig Start Date End Date Taking?  Authorizing Provider  chlorothiazide (DIURIL) 250 MG tablet Take 1 tablet (250 mg total) by mouth every morning. Do not resume until 11/25 04/23/13   Barton Dubois, MD  ferrous sulfate 325 (65 FE) MG tablet Take 325 mg by mouth 2 (two) times daily with a meal.    [provider]  HYDROcodone-acetaminophen (NORCO/VICODIN) 5-325 MG per tablet Take 1 tablet by mouth every 6 (six) hours as needed for moderate pain.    [provider]  ibuprofen (ADVIL,MOTRIN) 200 MG tablet Take 600 mg by mouth daily as needed. For pain    [provider]  lisinopril (PRINIVIL,ZESTRIL) 5 MG tablet Take 1 tablet (5 mg total) by mouth daily. 04/23/13   Barton Dubois, MD    Family History Family History  Problem Relation Age of Onset  . Diabetes Mother   . Hypertension Mother     Social History Social History  Substance Use Topics  . Smoking status: Never Smoker  . Smokeless tobacco: Never Used  . Alcohol use No     Allergies   Codeine and Morphine and related   Review of Systems Review of Systems  All other systems reviewed and are negative.    Physical Exam Updated Vital Signs BP (!) 145/84   Pulse 84   Temp 97.9 F (36.6 C) (  Oral)   Resp 14   Ht 1.676 m (5\' 6" )   Wt 126.1 kg (278 lb)   LMP 02/20/2012   SpO2 100%   BMI 44.87 kg/m   Physical Exam  Constitutional: She is oriented to person, place, and time. She appears well-developed and well-nourished.  Non-toxic appearance. No distress.  HENT:  Head: Normocephalic and atraumatic.  Eyes: Conjunctivae, EOM and lids are normal. Pupils are equal, round, and reactive to light.  Neck: Normal range of motion. Neck supple. No tracheal deviation present. No thyroid mass present.  Cardiovascular: Normal rate, regular rhythm and normal heart sounds.  Exam reveals no gallop.   No murmur heard. Pulmonary/Chest: Effort normal and breath sounds normal. No stridor. No respiratory distress. She has no decreased breath  sounds. She has no wheezes. She has no rhonchi. She has no rales.  Abdominal: Soft. Normal appearance and bowel sounds are normal. She exhibits no distension. There is no tenderness. There is no rebound and no CVA tenderness.    Musculoskeletal: Normal range of motion. She exhibits no edema or tenderness.  Neurological: She is alert and oriented to person, place, and time. She has normal strength. No cranial nerve deficit or sensory deficit. GCS eye subscore is 4. GCS verbal subscore is 5. GCS motor subscore is 6.  Skin: Skin is warm and dry. No abrasion and no rash noted.  Psychiatric: She has a normal mood and affect. Her speech is normal and behavior is normal.  Nursing note and vitals reviewed.    ED Treatments / Results  Labs (all labs ordered are listed, but only abnormal results are displayed) Labs Reviewed  CBC  D-DIMER, QUANTITATIVE (NOT AT [facility])  COMPREHENSIVE METABOLIC PANEL  LIPASE, BLOOD  TROPONIN I    EKG  EKG Interpretation  Date/Time:  Friday November 04 2016 07:37:58 EDT Ventricular Rate:  86 PR Interval:    QRS Duration: 103 QT Interval:  395 QTC Calculation: 473 R Axis:   41 Text Interpretation:  Sinus rhythm Low voltage, precordial leads Borderline T abnormalities, diffuse leads No significant change since last tracing Confirmed by Christop Hippert  MD, Eknoor Novack ([id number]) on 11/04/2016 7:56:35 AM       Radiology No results found.  Procedures Procedures (including critical care time)  Medications Ordered in ED Medications - No data to display   Initial Impression / Assessment and Plan / ED Course  I have reviewed the triage vital signs and the nursing notes.  Pertinent labs & imaging results that were available during my care of the patient were reviewed by me and considered in my medical decision making (see chart for details).     Patient describes her chest pain as being a crampy like sensation which is worse with movement. She had elevated d-dimer and a CT  of her chest was negative. Troponin is negative as well 2. Do not think that this represents ACS. Will prescribe muscle relaxants and discharged with return precautions Final Clinical Impressions(s) / ED Diagnoses   Final diagnoses:  None    New Prescriptions New Prescriptions   No medications on file     [name], MD 11/04/16 1238

## 2016-11-04 NOTE — ED Notes (Signed)
ED Provider at bedside. 

## 2016-11-04 NOTE — ED Notes (Signed)
Patient transported to CT 

## 2016-11-04 NOTE — ED Notes (Signed)
rn at bedside starting IV will collect labs  

## 2021-01-03 ENCOUNTER — Emergency Department (HOSPITAL_COMMUNITY): Payer: BC Managed Care – PPO

## 2021-01-03 ENCOUNTER — Encounter (HOSPITAL_COMMUNITY): Payer: Self-pay

## 2021-01-03 ENCOUNTER — Other Ambulatory Visit: Payer: Self-pay

## 2021-01-03 ENCOUNTER — Encounter: Payer: Self-pay | Admitting: Emergency Medicine

## 2021-01-03 ENCOUNTER — Inpatient Hospital Stay (HOSPITAL_COMMUNITY)
Admission: EM | Admit: 2021-01-03 | Discharge: 2021-01-09 | DRG: 371 | Disposition: A | Discharge status: Home / Self Care | Payer: BC Managed Care – PPO | Attending: Internal Medicine | Admitting: Internal Medicine

## 2021-01-03 DIAGNOSIS — Z90710 Acquired absence of both cervix and uterus: Secondary | ICD-10-CM

## 2021-01-03 DIAGNOSIS — Z9221 Personal history of antineoplastic chemotherapy: Secondary | ICD-10-CM

## 2021-01-03 DIAGNOSIS — Z79899 Other long term (current) drug therapy: Secondary | ICD-10-CM

## 2021-01-03 DIAGNOSIS — D61810 Antineoplastic chemotherapy induced pancytopenia: Secondary | ICD-10-CM | POA: Diagnosis present

## 2021-01-03 DIAGNOSIS — C801 Malignant (primary) neoplasm, unspecified: Secondary | ICD-10-CM | POA: Insufficient documentation

## 2021-01-03 DIAGNOSIS — A0472 Enterocolitis due to Clostridium difficile, not specified as recurrent: Secondary | ICD-10-CM | POA: Diagnosis not present

## 2021-01-03 DIAGNOSIS — I10 Essential (primary) hypertension: Secondary | ICD-10-CM | POA: Diagnosis present

## 2021-01-03 DIAGNOSIS — Z20822 Contact with and (suspected) exposure to covid-19: Secondary | ICD-10-CM | POA: Diagnosis present

## 2021-01-03 DIAGNOSIS — C799 Secondary malignant neoplasm of unspecified site: Secondary | ICD-10-CM | POA: Diagnosis present

## 2021-01-03 DIAGNOSIS — C7951 Secondary malignant neoplasm of bone: Secondary | ICD-10-CM | POA: Diagnosis present

## 2021-01-03 DIAGNOSIS — E860 Dehydration: Principal | ICD-10-CM

## 2021-01-03 DIAGNOSIS — E876 Hypokalemia: Secondary | ICD-10-CM

## 2021-01-03 DIAGNOSIS — Z853 Personal history of malignant neoplasm of breast: Secondary | ICD-10-CM

## 2021-01-03 DIAGNOSIS — Z8249 Family history of ischemic heart disease and other diseases of the circulatory system: Secondary | ICD-10-CM

## 2021-01-03 DIAGNOSIS — T451X5A Adverse effect of antineoplastic and immunosuppressive drugs, initial encounter: Secondary | ICD-10-CM | POA: Diagnosis present

## 2021-01-03 DIAGNOSIS — D61818 Other pancytopenia: Secondary | ICD-10-CM

## 2021-01-03 DIAGNOSIS — E785 Hyperlipidemia, unspecified: Secondary | ICD-10-CM | POA: Diagnosis present

## 2021-01-03 DIAGNOSIS — C773 Secondary and unspecified malignant neoplasm of axilla and upper limb lymph nodes: Secondary | ICD-10-CM | POA: Diagnosis present

## 2021-01-03 DIAGNOSIS — Z888 Allergy status to other drugs, medicaments and biological substances status: Secondary | ICD-10-CM

## 2021-01-03 DIAGNOSIS — Z9049 Acquired absence of other specified parts of digestive tract: Secondary | ICD-10-CM

## 2021-01-03 DIAGNOSIS — Z885 Allergy status to narcotic agent status: Secondary | ICD-10-CM

## 2021-01-03 DIAGNOSIS — C50919 Malignant neoplasm of unspecified site of unspecified female breast: Secondary | ICD-10-CM | POA: Diagnosis present

## 2021-01-03 DIAGNOSIS — R109 Unspecified abdominal pain: Secondary | ICD-10-CM | POA: Diagnosis not present

## 2021-01-03 DIAGNOSIS — Z833 Family history of diabetes mellitus: Secondary | ICD-10-CM

## 2021-01-03 HISTORY — DX: Malignant (primary) neoplasm, unspecified: C80.1

## 2021-01-03 LAB — C DIFFICILE QUICK SCREEN W PCR REFLEX
C Diff antigen: POSITIVE — AB
C Diff interpretation: DETECTED
C Diff toxin: POSITIVE — AB

## 2021-01-03 LAB — RESP PANEL BY RT-PCR (FLU A&B, COVID) ARPGX2
Influenza A by PCR: NEGATIVE
Influenza B by PCR: NEGATIVE
SARS Coronavirus 2 by RT PCR: NEGATIVE

## 2021-01-03 LAB — COMPREHENSIVE METABOLIC PANEL
ALT: 55 U/L — ABNORMAL HIGH (ref 0–44)
AST: 40 U/L (ref 15–41)
Albumin: 4.1 g/dL (ref 3.5–5.0)
Alkaline Phosphatase: 89 U/L (ref 38–126)
Anion gap: 11 (ref 5–15)
BUN: 22 mg/dL — ABNORMAL HIGH (ref 6–20)
CO2: 26 mmol/L (ref 22–32)
Calcium: 9.3 mg/dL (ref 8.9–10.3)
Chloride: 99 mmol/L (ref 98–111)
Creatinine, Ser: 0.93 mg/dL (ref 0.44–1.00)
GFR, Estimated: >60 mL/min (ref >60)
Glucose, Bld: 116 mg/dL — ABNORMAL HIGH (ref 70–99)
Potassium: 3.7 mmol/L (ref 3.5–5.1)
Sodium: 136 mmol/L (ref 135–145)
Total Bilirubin: 1.9 mg/dL — ABNORMAL HIGH (ref 0.3–1.2)
Total Protein: 7.8 g/dL (ref 6.5–8.1)

## 2021-01-03 LAB — CBC WITH DIFFERENTIAL/PLATELET
Abs Immature Granulocytes: 0 10*3/uL (ref 0.00–0.07)
Basophils Absolute: 0 10*3/uL (ref 0.0–0.1)
Basophils Relative: 0 %
Eosinophils Absolute: 0 10*3/uL (ref 0.0–0.5)
Eosinophils Relative: 0 %
HCT: 42.7 % (ref 36.0–46.0)
Hemoglobin: 14.1 g/dL (ref 12.0–15.0)
Lymphocytes Relative: 36 %
Lymphs Abs: 1 10*3/uL (ref 0.7–4.0)
MCH: 27 pg (ref 26.0–34.0)
MCHC: 33 g/dL (ref 30.0–36.0)
MCV: 81.8 fL (ref 80.0–100.0)
Monocytes Absolute: 0.1 10*3/uL (ref 0.1–1.0)
Monocytes Relative: 3 %
Neutro Abs: 1.7 10*3/uL (ref 1.7–7.7)
Neutrophils Relative %: 61 %
Platelets: 145 10*3/uL — ABNORMAL LOW (ref 150–400)
RBC: 5.22 MIL/uL — ABNORMAL HIGH (ref 3.87–5.11)
RDW: 13.5 % (ref 11.5–15.5)
WBC: 2.8 10*3/uL — ABNORMAL LOW (ref 4.0–10.5)
nRBC: 0 % (ref 0.0–0.2)

## 2021-01-03 LAB — URINALYSIS, ROUTINE W REFLEX MICROSCOPIC
Bilirubin Urine: NEGATIVE
Glucose, UA: 50 mg/dL — AB
Hgb urine dipstick: NEGATIVE
Ketones, ur: 5 mg/dL — AB
Leukocytes,Ua: NEGATIVE
Nitrite: NEGATIVE
Protein, ur: 30 mg/dL — AB
Specific Gravity, Urine: 1.021 (ref 1.005–1.030)
pH: 6 (ref 5.0–8.0)

## 2021-01-03 LAB — LIPASE, BLOOD: Lipase: 27 U/L (ref 11–51)

## 2021-01-03 MED ORDER — CIPROFLOXACIN IN D5W 400 MG/200ML IV SOLN: 400.0000 mg | Freq: Once | INTRAVENOUS | Status: DC

## 2021-01-03 MED ORDER — ONDANSETRON HCL 4 MG/2ML IJ SOLN
4.0000 mg | Freq: Four times a day (QID) | INTRAMUSCULAR | Status: DC | PRN
  Administered 2021-01-04 – 2021-01-08 (×4): 4 mg via INTRAVENOUS
  Filled 2021-01-03 (×4): qty 2

## 2021-01-03 MED ORDER — LISINOPRIL 20 MG PO TABS
20.0000 mg | ORAL_TABLET | Freq: Every day | ORAL | Status: DC
  Administered 2021-01-04 – 2021-01-09 (×6): 20 mg via ORAL
  Filled 2021-01-03 (×6): qty 1

## 2021-01-03 MED ORDER — SODIUM CHLORIDE 0.9 % IV BOLUS
1000.0000 mL | Freq: Once | INTRAVENOUS | Status: AC
  Administered 2021-01-03: 1000 mL via INTRAVENOUS

## 2021-01-03 MED ORDER — VANCOMYCIN HCL 125 MG PO CAPS
125.0000 mg | ORAL_CAPSULE | Freq: Four times a day (QID) | ORAL | Status: DC
  Administered 2021-01-03 – 2021-01-06 (×10): 125 mg via ORAL
  Filled 2021-01-03 (×11): qty 1

## 2021-01-03 MED ORDER — LISINOPRIL-HYDROCHLOROTHIAZIDE 20-25 MG PO TABS: 1.0000 | ORAL_TABLET | Freq: Every day | ORAL | Status: DC

## 2021-01-03 MED ORDER — LACTATED RINGERS IV SOLN: INTRAVENOUS | Status: DC

## 2021-01-03 MED ORDER — ROSUVASTATIN CALCIUM 10 MG PO TABS
10.0000 mg | ORAL_TABLET | Freq: Every day | ORAL | Status: DC
  Administered 2021-01-03 – 2021-01-08 (×6): 10 mg via ORAL
  Filled 2021-01-03 (×7): qty 1

## 2021-01-03 MED ORDER — ACETAMINOPHEN 325 MG PO TABS
650.0000 mg | ORAL_TABLET | Freq: Four times a day (QID) | ORAL | Status: DC | PRN
  Administered 2021-01-04 (×3): 650 mg via ORAL
  Filled 2021-01-03 (×3): qty 2

## 2021-01-03 MED ORDER — ENOXAPARIN SODIUM 60 MG/0.6ML IJ SOSY
50.0000 mg | PREFILLED_SYRINGE | INTRAMUSCULAR | Status: DC
  Administered 2021-01-03 – 2021-01-08 (×6): 50 mg via SUBCUTANEOUS
  Filled 2021-01-03 (×6): qty 0.6

## 2021-01-03 MED ORDER — FENTANYL CITRATE (PF) 100 MCG/2ML IJ SOLN
25.0000 ug | Freq: Once | INTRAMUSCULAR | Status: AC
Start: 2021-01-03 — End: 2021-01-03
  Administered 2021-01-03: 25 ug via INTRAVENOUS
  Filled 2021-01-03: qty 2

## 2021-01-03 MED ORDER — METRONIDAZOLE 500 MG/100ML IV SOLN: 500.0000 mg | Freq: Three times a day (TID) | INTRAVENOUS | Status: DC

## 2021-01-03 MED ORDER — ONDANSETRON HCL 4 MG PO TABS: 4.0000 mg | ORAL_TABLET | Freq: Four times a day (QID) | ORAL | Status: DC | PRN

## 2021-01-03 MED ORDER — ONDANSETRON HCL 4 MG/2ML IJ SOLN
4.0000 mg | Freq: Once | INTRAMUSCULAR | Status: AC
  Administered 2021-01-03: 4 mg via INTRAVENOUS
  Filled 2021-01-03: qty 2

## 2021-01-03 MED ORDER — ACETAMINOPHEN 650 MG RE SUPP: 650.0000 mg | Freq: Four times a day (QID) | RECTAL | Status: DC | PRN

## 2021-01-03 MED ORDER — IOHEXOL 350 MG/ML SOLN
80.0000 mL | Freq: Once | INTRAVENOUS | Status: AC | PRN
  Administered 2021-01-03: 80 mL via INTRAVENOUS

## 2021-01-03 MED ORDER — HYDROCHLOROTHIAZIDE 25 MG PO TABS
25.0000 mg | ORAL_TABLET | Freq: Every day | ORAL | Status: DC
  Administered 2021-01-04 – 2021-01-09 (×6): 25 mg via ORAL
  Filled 2021-01-03 (×6): qty 1

## 2021-01-03 MED ORDER — ALUM & MAG HYDROXIDE-SIMETH 200-200-20 MG/5ML PO SUSP
30.0000 mL | Freq: Once | ORAL | Status: AC
  Administered 2021-01-03: 30 mL via ORAL
  Filled 2021-01-03: qty 30

## 2021-01-03 NOTE — ED Notes (Signed)
Date and time results received: 01/03/21 19:47   Test: C. Diff  Critical Value: positive   Name of Provider Notified: Dr. Gilford Raid

## 2021-01-03 NOTE — H&P (Signed)
History and Physical    [name] [name] [id number] DOB: [birth year] DOA: 01/03/2021  PCP: System, Provider Not In  Patient coming from: Home  I have personally briefly reviewed patient's old medical records in [place]  Chief Complaint: Diarrhea, abdominal pain  HPI: [name] [name] is a 55 y.o. female with medical history significant for recently diagnosed metastatic right breast cancer started on chemotherapy 12/29/2020, hypertension, and hyperlipidemia who presented to the ED for evaluation of abdominal pain and frequent watery diarrhea.  Patient states she underwent her first chemotherapy treatment on 12/29/2020.  She says she was feeling well until Thursday evening when she developed new onset of generalized abdominal pain with frequent watery diarrhea.  She has had associated nausea without emesis.  She has not been able to maintain any adequate oral intake.  She has not had any subjective fevers, chills, diaphoresis, chest pain, dyspnea, dysuria.  Patient had a left-sided Port-A-Cath placed 12/28/2020.  She denies any recent antibiotic use.  She denies any sick contacts.  ED Course:  Initial vitals showed BP 137/92, pulse 78, RR 16, temp 98.1 F, SPO2 100% on room air.  Labs show WBC 2.8 (previously 4.3 on 12/29/2020), hemoglobin 14.1, platelets 145,000, sodium 136, potassium 3.7, bicarb 26, BUN 22, creatinine 0.93, serum glucose 116, lipase 27.  C. difficile quick screen is antigen and toxin positive.  GI pathogen panel pending.  SARS-CoV-2 PCR negative.  Influenza is negative.  CT abdomen/pelvis with contrast showed changes consistent with diffuse colitis negative for evidence of perforation or abscess.  Patient was given 3 L normal saline and started on oral vancomycin.  The hospitalist service was consulted to admit for further evaluation and management.  Review of Systems: All systems reviewed and are negative except as documented in history of present illness  above.   Past Medical History:  Diagnosis Date   Abnormal uterine bleeding    Anemia    Cancer ([place])    Dysrhythmia 05/31/2011   brief episodes of sinus tachycardia- controlled by med   Headache(784.0)    Hypertension    Postpartum cardiomyopathy 05/30/1988   resolved    Past Surgical History:  Procedure Laterality Date   ABDOMINAL HYSTERECTOMY  02/20/2012   Procedure: HYSTERECTOMY ABDOMINAL;  Surgeon: Woodroe Mode, MD;  Location: [facility];  Service: Gynecology;  Laterality: N/A;   gallstones     MULTIPLE TOOTH EXTRACTIONS     TUBAL LIGATION      Social History:  reports that she has never smoked. She has never used smokeless tobacco. She reports that she does not drink alcohol and does not use drugs.  Allergies  Allergen Reactions   Amlodipine Swelling and Other (See Comments)    Only the feet became swollen   Morphine And Related Hives, Nausea And Vomiting, Nausea Only and Other (See Comments)    Headaches and sweats, also    Codeine Hives, Nausea Only and Other (See Comments)    Headaches, also    Family History  Problem Relation Age of Onset   Diabetes Mother    Hypertension Mother      Prior to Admission medications   Medication Sig Start Date End Date Taking? Authorizing Provider  aspirin-acetaminophen-caffeine (EXCEDRIN MIGRAINE) [phone] MG tablet Take 2 tablets by mouth every 6 (six) hours as needed for headache.   Yes [provider]  Cholecalciferol (VITAMIN D3 PO) Take 1 capsule by mouth daily.   Yes [provider]  Cyanocobalamin (VITAMIN B12 PO) Take 1 tablet by  mouth daily.   Yes [provider]  ferrous sulfate 325 (65 FE) MG tablet Take 325 mg by mouth 2 (two) times a week.   Yes [provider]  lisinopril-hydrochlorothiazide (PRINZIDE,ZESTORETIC) 20-25 MG tablet Take 1 tablet by mouth daily.   Yes [provider]  loperamide (IMODIUM A-D) 2 MG tablet Take 2 mg by mouth 4 (four) times daily as needed  for diarrhea or loose stools.   Yes [provider]  naproxen sodium (ALEVE) 220 MG tablet Take 220-440 mg by mouth 2 (two) times daily as needed (for headaches).   Yes [provider]  POTASSIUM PO Take 1 tablet by mouth every other day.   Yes [provider]  rosuvastatin (CRESTOR) 10 MG tablet Take 10 mg by mouth at bedtime.   Yes [provider]  diazepam (VALIUM) 2 MG tablet Take 1 tablet (2 mg total) by mouth every 6 (six) hours as needed for muscle spasms. Patient not taking: Reported on 01/03/2021 11/04/16   [name], MD    Physical Exam: Vitals:   01/03/21 2000 01/03/21 2030 01/03/21 2139 01/03/21 2210  BP: 130/69 134/85 122/75   Pulse: (!) 54 62 64   Resp: '15 14 20   '$ Temp:   98.4 F (36.9 C)   TempSrc:   Oral   SpO2: 100% 98% 100%   Weight:    104.5 kg  Height:    '5\' 6"'$  (1.676 m)   Constitutional: NAD, calm, comfortable Eyes: PERRL, lids and conjunctivae normal ENMT: Mucous membranes are moist. Posterior pharynx clear of any exudate or lesions.Normal dentition.  Neck: normal, supple, no masses. Respiratory: clear to auscultation bilaterally, no wheezing, no crackles. Normal respiratory effort. No accessory muscle use.  Cardiovascular: Regular rate and rhythm, no murmurs / rubs / gallops. No extremity edema. 2+ pedal pulses. Abdomen: Mild generalized abdominal tenderness, no masses palpated. No hepatosplenomegaly. Bowel sounds positive.  Musculoskeletal: no clubbing / cyanosis. No joint deformity upper and lower extremities. Good ROM, no contractures. Normal muscle tone.  Skin: no rashes, lesions, ulcers. No induration Neurologic: CN 2-12 grossly intact. Sensation intact. Strength 5/5 in all 4.  Psychiatric: Normal judgment and insight. Alert and oriented x 3. Normal mood.   Labs on Admission: I have personally reviewed following labs and imaging studies  CBC: Recent Labs  Lab 01/03/21 1428  WBC 2.8*  NEUTROABS 1.7  HGB 14.1   HCT 42.7  MCV 81.8  PLT [id number]*   Basic Metabolic Panel: Recent Labs  Lab 01/03/21 1428  NA 136  K 3.7  CL 99  CO2 26  GLUCOSE 116*  BUN 22*  CREATININE 0.93  CALCIUM 9.3   GFR: Estimated Creatinine Clearance: 84.5 mL/min (by C-G formula based on SCr of 0.93 mg/dL). Liver Function Tests: Recent Labs  Lab 01/03/21 1428  AST 40  ALT 55*  ALKPHOS 89  BILITOT 1.9*  PROT 7.8  ALBUMIN 4.1   Recent Labs  Lab 01/03/21 1428  LIPASE 27   No results for input(s): AMMONIA in the last 168 hours. Coagulation Profile: No results for input(s): INR, PROTIME in the last 168 hours. Cardiac Enzymes: No results for input(s): CKTOTAL, CKMB, CKMBINDEX, TROPONINI in the last 168 hours. BNP (last 3 results) No results for input(s): PROBNP in the last 8760 hours. HbA1C: No results for input(s): HGBA1C in the last 72 hours. CBG: No results for input(s): GLUCAP in the last 168 hours. Lipid Profile: No results for input(s): CHOL, HDL, LDLCALC, TRIG, CHOLHDL, LDLDIRECT  in the last 72 hours. Thyroid Function Tests: No results for input(s): TSH, T4TOTAL, FREET4, T3FREE, THYROIDAB in the last 72 hours. Anemia Panel: No results for input(s): VITAMINB12, FOLATE, FERRITIN, TIBC, IRON, RETICCTPCT in the last 72 hours. Urine analysis:    Component Value Date/Time   COLORURINE YELLOW 01/03/2021 1720   APPEARANCEUR HAZY (A) 01/03/2021 1720   LABSPEC 1.021 01/03/2021 1720   PHURINE 6.0 01/03/2021 1720   GLUCOSEU 50 (A) 01/03/2021 1720   HGBUR NEGATIVE 01/03/2021 1720   BILIRUBINUR NEGATIVE 01/03/2021 1720   KETONESUR 5 (A) 01/03/2021 1720   PROTEINUR 30 (A) 01/03/2021 1720   UROBILINOGEN 1.0 04/19/2013 2233   NITRITE NEGATIVE 01/03/2021 1720   LEUKOCYTESUR NEGATIVE 01/03/2021 1720    Radiological Exams on Admission: CT ABDOMEN PELVIS W CONTRAST  Result Date: 01/03/2021 CLINICAL DATA:  Diarrhea abdominal pain EXAM: CT ABDOMEN AND PELVIS WITH CONTRAST TECHNIQUE: Multidetector CT imaging of  the abdomen and pelvis was performed using the standard protocol following bolus administration of intravenous contrast. CONTRAST:  86m OMNIPAQUE IOHEXOL 350 MG/ML SOLN COMPARISON:  CT 12/21/2020 FINDINGS: Lower chest: Lung bases demonstrate no acute consolidation or effusion. Cardiac size upper limits of normal. Hepatobiliary: No focal liver abnormality is seen. Status post cholecystectomy. No biliary dilatation. Pancreas: Unremarkable. No pancreatic ductal dilatation or surrounding inflammatory changes. Spleen: Normal in size without focal abnormality. Adrenals/Urinary Tract: Adrenal glands are unremarkable. Kidneys are normal, without renal calculi, focal lesion, or hydronephrosis. Bladder is unremarkable. Stomach/Bowel: The stomach is nonenlarged. No dilated small bowel. Mild diffuse colon wall thickening with mucosal enhancement consistent with colitis. Negative appendix. Vascular/Lymphatic: Nonaneurysmal aorta. Similar right cardio phrenic fat pad lymph node. No increasing adenopathy. Reproductive: Status post hysterectomy. No adnexal masses. Other: Negative for free air or free fluid. Small fat containing umbilical hernia Musculoskeletal: No acute osseous abnormality IMPRESSION: 1. Interim development of mild diffuse colon wall thickening with mucosal enhancement consistent with colitis of infectious or inflammatory etiology, to include C difficile. Negative for perforation or abscess. Electronically Signed   By: KDonavan FoilM.D.   On: 01/03/2021 19:25    EKG: Not performed.  Assessment/Plan Principal Problem:   C. difficile colitis Active Problems:   Hypertension   Hyperlipidemia   [place]   [name] Lovatois a 55y.o. female with medical history significant for recently diagnosed metastatic right breast cancer started on chemotherapy 12/29/2020, hypertension, and hyperlipidemia who is admitted with C. difficile colitis.  C. difficile colitis: C. difficile antigen and  toxin positive.  CT showing colitis.  Has been having continued watery diarrhea. -Continue oral vancomycin -Continue IV fluid hydration overnight -[place] diet as tolerated  Rright-sided breast cancer with metastases to the vertebrae and right axillary nodes: [facility]  Started chemotherapy on 12/29/2020.  Hypertension: Continue lisinopril-HCTZ.  Hyperlipidemia: Continue rosuvastatin.  DVT prophylaxis: Lovenox Code Status: Full code, confirmed with patient Family Communication: Discussed with patient, she has discussed with family Disposition Plan: Home and likely discharge to home pending clinical progress Consults called: None  Level of care: Med-Surg Admission status:  Status is: Observation  The patient remains OBS appropriate and will d/c before 2 midnights.  Dispo: The patient is from: Home              Anticipated d/c is to: Home              Patient currently is not medically stable to d/c.   Difficult to place patient No  Zada Finders MD [facility]  If 7PM-7AM, please contact night-coverage [email or web address]  01/03/2021, 10:52 PM

## 2021-01-03 NOTE — ED Provider Notes (Signed)
[name] [name] DEPT Provider Note   CSN: [id number] Arrival date & time: 01/03/21  1337     History Chief Complaint  Patient presents with   Abdominal Pain   Chest Pain    [name] [name] is a 55 y.o. female.  Pt presents to the ED today with abdominal pain and diarrhea.  She has pain that radiates up into her chest.  Pt was recently diagnosed with metastatic breast cancer.  She had her first chemo treatment on 8/3.  She felt fine until 8/5 when she developed severe diarrhea.  She has tried imodium, but it has not helped.  She denies f/c.  She feels very weak.      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 difficile colitis 01/03/2021   Sepsis ([place]) 04/20/2013   Atypical pneumonia 04/20/2013   Vertigo 02/29/2012   Leiomyoma of uterus, unspecified 11/16/2011   Metrorrhagia 11/16/2011   Acute blood loss anemia 11/16/2011   Hypertension 11/16/2011   Morbid obesity ([place]) 11/16/2011    Past Surgical History:  Procedure Laterality Date   ABDOMINAL HYSTERECTOMY  02/20/2012   Procedure: HYSTERECTOMY ABDOMINAL;  Surgeon: Woodroe Mode, MD;  Location: [facility];  Service: Gynecology;  Laterality: N/A;   gallstones     MULTIPLE TOOTH EXTRACTIONS     TUBAL LIGATION       OB History     Gravida  2   Para  2   Term      Preterm      AB      Living  2      SAB      IAB      Ectopic      Multiple      Live Births              Family History  Problem Relation Age of Onset   Diabetes Mother    Hypertension Mother     Social History   Tobacco Use   Smoking status: Never   Smokeless tobacco: Never  Vaping Use   Vaping  Use: Never used  Substance Use Topics   Alcohol use: No   Drug use: No    Home Medications Prior to Admission medications   Medication Sig Start Date End Date Taking? Authorizing Provider  aspirin-acetaminophen-caffeine (EXCEDRIN MIGRAINE) [phone] MG tablet Take 2 tablets by mouth every 6 (six) hours as needed for headache.   Yes [provider]  Cholecalciferol (VITAMIN D3 PO) Take 1 capsule by mouth daily.   Yes [provider]  Cyanocobalamin (VITAMIN B12 PO) Take 1 tablet by mouth daily.   Yes [provider]  ferrous sulfate 325 (65 FE) MG tablet Take 325 mg by mouth 2 (two) times a week.   Yes [provider]  lisinopril-hydrochlorothiazide (PRINZIDE,ZESTORETIC) 20-25 MG tablet Take 1 tablet by mouth daily.   Yes [provider]  loperamide (IMODIUM A-D) 2 MG tablet Take 2 mg by mouth 4 (four) times daily as needed for diarrhea or loose stools.   Yes [provider]  naproxen sodium (ALEVE) 220 MG tablet Take 220-440 mg  by mouth 2 (two) times daily as needed (for headaches).   Yes [provider]  POTASSIUM PO Take 1 tablet by mouth every other day.   Yes [provider]  rosuvastatin (CRESTOR) 10 MG tablet Take 10 mg by mouth at bedtime.   Yes [provider]  diazepam (VALIUM) 2 MG tablet Take 1 tablet (2 mg total) by mouth every 6 (six) hours as needed for muscle spasms. Patient not taking: Reported on 01/03/2021 11/04/16   [name], MD    Allergies    Amlodipine, Morphine and related, and Codeine  Review of Systems   Review of Systems  Cardiovascular:  Positive for chest pain.  Gastrointestinal:  Positive for abdominal pain and diarrhea.  All other systems reviewed and are negative.  Physical Exam Updated Vital Signs BP 130/69   Pulse (!) 54   Temp 98.1 F (36.7 C) (Oral)   Resp 15   LMP 02/20/2012   SpO2 100%   Physical Exam Vitals and nursing note reviewed.  Constitutional:       Appearance: She is well-developed.  HENT:     Head: Normocephalic and atraumatic.     Mouth/Throat:     Mouth: Mucous membranes are dry.  Eyes:     Extraocular Movements: Extraocular movements intact.     Pupils: Pupils are equal, round, and reactive to light.  Cardiovascular:     Rate and Rhythm: Normal rate and regular rhythm.     Heart sounds: Normal heart sounds.  Pulmonary:     Effort: Pulmonary effort is normal.     Breath sounds: Normal breath sounds.  Abdominal:     General: Abdomen is flat. Bowel sounds are normal.     Palpations: Abdomen is soft.     Tenderness: There is generalized abdominal tenderness.  Skin:    General: Skin is warm.     Capillary Refill: Capillary refill takes less than 2 seconds.  Neurological:     General: No focal deficit present.     Mental Status: She is alert and oriented to person, place, and time.  Psychiatric:        Mood and Affect: Mood normal.        Behavior: Behavior normal.    ED Results / Procedures / Treatments   Labs (all labs ordered are listed, but only abnormal results are displayed) Labs Reviewed  C DIFFICILE QUICK SCREEN W PCR REFLEX   - Abnormal; Notable for the following components:      Result Value   C Diff antigen POSITIVE (*)    C Diff toxin POSITIVE (*)    All other components within normal limits  CBC WITH DIFFERENTIAL/PLATELET - Abnormal; Notable for the following components:   WBC 2.8 (*)    RBC 5.22 (*)    Platelets 145 (*)    All other components within normal limits  COMPREHENSIVE METABOLIC PANEL - Abnormal; Notable for the following components:   Glucose, Bld 116 (*)    BUN 22 (*)    ALT 55 (*)    Total Bilirubin 1.9 (*)    All other components within normal limits  URINALYSIS, ROUTINE W REFLEX MICROSCOPIC - Abnormal; Notable for the following components:   APPearance HAZY (*)    Glucose, UA 50 (*)    Ketones, ur 5 (*)    Protein, ur 30 (*)    Bacteria, UA RARE (*)    All other components  within normal limits  GASTROINTESTINAL PANEL BY PCR, STOOL (  REPLACES STOOL CULTURE)  RESP PANEL BY RT-PCR (FLU A&B, COVID) [id number]  LIPASE, BLOOD    EKG None  Radiology CT ABDOMEN PELVIS W CONTRAST  Result Date: 01/03/2021 CLINICAL DATA:  Diarrhea abdominal pain EXAM: CT ABDOMEN AND PELVIS WITH CONTRAST TECHNIQUE: Multidetector CT imaging of the abdomen and pelvis was performed using the standard protocol following bolus administration of intravenous contrast. CONTRAST:  9m OMNIPAQUE IOHEXOL 350 MG/ML SOLN COMPARISON:  CT 12/21/2020 FINDINGS: Lower chest: Lung bases demonstrate no acute consolidation or effusion. Cardiac size upper limits of normal. Hepatobiliary: No focal liver abnormality is seen. Status post cholecystectomy. No biliary dilatation. Pancreas: Unremarkable. No pancreatic ductal dilatation or surrounding inflammatory changes. Spleen: Normal in size without focal abnormality. Adrenals/Urinary Tract: Adrenal glands are unremarkable. Kidneys are normal, without renal calculi, focal lesion, or hydronephrosis. Bladder is unremarkable. Stomach/Bowel: The stomach is nonenlarged. No dilated small bowel. Mild diffuse colon wall thickening with mucosal enhancement consistent with colitis. Negative appendix. Vascular/Lymphatic: Nonaneurysmal aorta. Similar right cardio phrenic fat pad lymph node. No increasing adenopathy. Reproductive: Status post hysterectomy. No adnexal masses. Other: Negative for free air or free fluid. Small fat containing umbilical hernia Musculoskeletal: No acute osseous abnormality IMPRESSION: 1. Interim development of mild diffuse colon wall thickening with mucosal enhancement consistent with colitis of infectious or inflammatory etiology, to include C difficile. Negative for perforation or abscess. Electronically Signed   By: KDonavan FoilM.D.   On: 01/03/2021 19:25    Procedures Procedures   Medications Ordered in ED Medications  vancomycin (VANCOCIN) capsule 125  mg (has no administration in time range)  sodium chloride 0.9 % bolus 1,000 mL (0 mLs Intravenous Stopped 01/03/21 1544)  ondansetron (ZOFRAN) injection 4 mg (4 mg Intravenous Given 01/03/21 1419)  fentaNYL (SUBLIMAZE) injection 25 mcg (25 mcg Intravenous Given 01/03/21 1419)  sodium chloride 0.9 % bolus 1,000 mL (0 mLs Intravenous Stopped 01/03/21 1644)  fentaNYL (SUBLIMAZE) injection 25 mcg (25 mcg Intravenous Given 01/03/21 1730)  iohexol (OMNIPAQUE) 350 MG/ML injection 80 mL (80 mLs Intravenous Contrast Given 01/03/21 1852)  alum & mag hydroxide-simeth (MAALOX/MYLANTA) 200-200-20 MG/5ML suspension 30 mL (30 mLs Oral Given 01/03/21 2005)  sodium chloride 0.9 % bolus 1,000 mL (1,000 mLs Intravenous New Bag/Given 01/03/21 2004)    ED Course  I have reviewed the triage vital signs and the nursing notes.  Pertinent labs & imaging results that were available during my care of the patient were reviewed by me and considered in my medical decision making (see chart for details).    MDM Rules/Calculators/A&P                           Pt still has significant diarrhea.  She is + for c.diff.  CT shows diffuse colitis.  Pt's WBC is only 2.8 with cdiff.  I ordered oral vancomycin.  Pt is given IVFs.  She is immunosuppressed and I think should be watched over night.  She is d/w Dr. PPosey Pronto([facility]) for admission.   Final Clinical Impression(s) / ED Diagnoses Final diagnoses:  Dehydration  History of cancer chemotherapy  C. difficile colitis    Rx / DC Orders ED Discharge Orders     None        HIsla Pence MD 01/03/21 2009

## 2021-01-03 NOTE — ED Triage Notes (Signed)
Pt has first chemo treatment on Tuesday. Pt reports abdominal pain and chest pain that began Friday. Pt has hx of breast cancer.   '324mg'$  aspirin

## 2021-01-03 NOTE — ED Notes (Signed)
Pt provided apple juice, EDP [place] aware.

## 2021-01-04 DIAGNOSIS — Z888 Allergy status to other drugs, medicaments and biological substances status: Secondary | ICD-10-CM | POA: Diagnosis not present

## 2021-01-04 DIAGNOSIS — Z9221 Personal history of antineoplastic chemotherapy: Secondary | ICD-10-CM | POA: Diagnosis not present

## 2021-01-04 DIAGNOSIS — C50919 Malignant neoplasm of unspecified site of unspecified female breast: Secondary | ICD-10-CM | POA: Diagnosis present

## 2021-01-04 DIAGNOSIS — E860 Dehydration: Secondary | ICD-10-CM | POA: Diagnosis present

## 2021-01-04 DIAGNOSIS — R109 Unspecified abdominal pain: Secondary | ICD-10-CM | POA: Diagnosis present

## 2021-01-04 DIAGNOSIS — E876 Hypokalemia: Secondary | ICD-10-CM | POA: Diagnosis present

## 2021-01-04 DIAGNOSIS — Z833 Family history of diabetes mellitus: Secondary | ICD-10-CM | POA: Diagnosis not present

## 2021-01-04 DIAGNOSIS — Z90710 Acquired absence of both cervix and uterus: Secondary | ICD-10-CM | POA: Diagnosis not present

## 2021-01-04 DIAGNOSIS — Z853 Personal history of malignant neoplasm of breast: Secondary | ICD-10-CM | POA: Diagnosis not present

## 2021-01-04 DIAGNOSIS — E785 Hyperlipidemia, unspecified: Secondary | ICD-10-CM | POA: Diagnosis present

## 2021-01-04 DIAGNOSIS — T451X5A Adverse effect of antineoplastic and immunosuppressive drugs, initial encounter: Secondary | ICD-10-CM | POA: Diagnosis present

## 2021-01-04 DIAGNOSIS — Z8249 Family history of ischemic heart disease and other diseases of the circulatory system: Secondary | ICD-10-CM | POA: Diagnosis not present

## 2021-01-04 DIAGNOSIS — C773 Secondary and unspecified malignant neoplasm of axilla and upper limb lymph nodes: Secondary | ICD-10-CM | POA: Diagnosis present

## 2021-01-04 DIAGNOSIS — Z9049 Acquired absence of other specified parts of digestive tract: Secondary | ICD-10-CM | POA: Diagnosis not present

## 2021-01-04 DIAGNOSIS — A0472 Enterocolitis due to Clostridium difficile, not specified as recurrent: Secondary | ICD-10-CM | POA: Diagnosis present

## 2021-01-04 DIAGNOSIS — Z885 Allergy status to narcotic agent status: Secondary | ICD-10-CM | POA: Diagnosis not present

## 2021-01-04 DIAGNOSIS — Z79899 Other long term (current) drug therapy: Secondary | ICD-10-CM | POA: Diagnosis not present

## 2021-01-04 DIAGNOSIS — D61818 Other pancytopenia: Secondary | ICD-10-CM | POA: Diagnosis not present

## 2021-01-04 DIAGNOSIS — D61810 Antineoplastic chemotherapy induced pancytopenia: Secondary | ICD-10-CM | POA: Diagnosis present

## 2021-01-04 DIAGNOSIS — I10 Essential (primary) hypertension: Secondary | ICD-10-CM | POA: Diagnosis present

## 2021-01-04 DIAGNOSIS — Z20822 Contact with and (suspected) exposure to covid-19: Secondary | ICD-10-CM | POA: Diagnosis present

## 2021-01-04 DIAGNOSIS — C7951 Secondary malignant neoplasm of bone: Secondary | ICD-10-CM | POA: Diagnosis present

## 2021-01-04 DIAGNOSIS — C799 Secondary malignant neoplasm of unspecified site: Secondary | ICD-10-CM | POA: Diagnosis present

## 2021-01-04 LAB — CBC
HCT: 37.6 % (ref 36.0–46.0)
Hemoglobin: 11.8 g/dL — ABNORMAL LOW (ref 12.0–15.0)
MCH: 26.5 pg (ref 26.0–34.0)
MCHC: 31.4 g/dL (ref 30.0–36.0)
MCV: 84.3 fL (ref 80.0–100.0)
Platelets: 106 10*3/uL — ABNORMAL LOW (ref 150–400)
RBC: 4.46 MIL/uL (ref 3.87–5.11)
RDW: 13.6 % (ref 11.5–15.5)
WBC: 1.8 10*3/uL — ABNORMAL LOW (ref 4.0–10.5)
nRBC: 0 % (ref 0.0–0.2)

## 2021-01-04 LAB — GASTROINTESTINAL PANEL BY PCR, STOOL (REPLACES STOOL CULTURE)

## 2021-01-04 LAB — HIV ANTIBODY (ROUTINE TESTING W REFLEX): HIV Screen 4th Generation wRfx: NONREACTIVE

## 2021-01-04 LAB — BASIC METABOLIC PANEL
Anion gap: 7 (ref 5–15)
BUN: 16 mg/dL (ref 6–20)
CO2: 27 mmol/L (ref 22–32)
Calcium: 8.2 mg/dL — ABNORMAL LOW (ref 8.9–10.3)
Chloride: 101 mmol/L (ref 98–111)
Creatinine, Ser: 0.82 mg/dL (ref 0.44–1.00)
GFR, Estimated: >60 mL/min (ref >60)
Glucose, Bld: 102 mg/dL — ABNORMAL HIGH (ref 70–99)
Potassium: 3.1 mmol/L — ABNORMAL LOW (ref 3.5–5.1)
Sodium: 135 mmol/L (ref 135–145)

## 2021-01-04 LAB — MAGNESIUM: Magnesium: 2.2 mg/dL (ref 1.7–2.4)

## 2021-01-04 MED ORDER — SODIUM CHLORIDE 0.9 % IV SOLN: INTRAVENOUS | Status: DC

## 2021-01-04 MED ORDER — FENTANYL CITRATE (PF) 100 MCG/2ML IJ SOLN
25.0000 ug | Freq: Once | INTRAMUSCULAR | Status: AC
Start: 2021-01-04 — End: 2021-01-04
  Administered 2021-01-04: 25 ug via INTRAVENOUS
  Filled 2021-01-04: qty 2

## 2021-01-04 MED ORDER — ALUM & MAG HYDROXIDE-SIMETH 200-200-20 MG/5ML PO SUSP
15.0000 mL | Freq: Four times a day (QID) | ORAL | Status: DC | PRN
  Administered 2021-01-04: 15 mL via ORAL
  Filled 2021-01-04: qty 30

## 2021-01-04 MED ORDER — POTASSIUM CHLORIDE 10 MEQ/100ML IV SOLN
10.0000 meq | INTRAVENOUS | Status: AC
  Administered 2021-01-04 (×4): 10 meq via INTRAVENOUS
  Filled 2021-01-04 (×2): qty 100

## 2021-01-04 MED ORDER — MORPHINE SULFATE (PF) 2 MG/ML IV SOLN
0.5000 mg | Freq: Once | INTRAVENOUS | Status: DC
Start: 2021-01-04 — End: 2021-01-04

## 2021-01-04 NOTE — Assessment & Plan Note (Addendum)
-   s/p port placement on 8/1 and first chemo on 8/2 - follows with Dr. Wendee Beavers at [facility] - mets to T3, L4 and left sacrum; and right axillary node - regimen: pertuzumab, docetaxel, carboplatin, kanjinti

## 2021-01-04 NOTE — Assessment & Plan Note (Signed)
-   replete and check as needed - 2/2 GI losses

## 2021-01-04 NOTE — Assessment & Plan Note (Signed)
Continue Crestor 

## 2021-01-04 NOTE — Progress Notes (Signed)
Progress Note    [name] [name]   [id number]  DOB: Oct 14, 1965  DOA: 01/03/2021     0  PCP: System, Provider Not In  Initial CC: N/V/D, abd pain  Hospital Course: [name] [name] is a 55 yo female with PMH recent diagnosis Stage IV right breast cancer with mets to right axillary node and bone (s/p 1st chemo on 12/29/20) who presented with N/V, abd pain, and diarrhea.  She underwent right chest PAC placement on 8/1 for chemo. She then underwent 1st chemo session on 12/29/20.   She underwent work-up for the diarrhea on admission including C. difficile testing which was positive.  CT abdomen/pelvis was then obtained which showed mild diffuse colon wall thickening with mucosal enhancement consistent with colitis.  Negative for perforation or abscess. She was admitted, started on oral vancomycin and fluids due to inability for adequate oral intake.  Interval History:  Seen in her room this morning.  Still having ongoing abdominal pain/cramping and significant nausea.  Has no appetite and does not wish to take in much clear liquid diet, therefore we will keep in hospital and keep on fluids along with other treatment.  ROS: Constitutional: positive for fatigue and malaise, negative for chills and fevers, Respiratory: negative for cough and sputum, Cardiovascular: negative for chest pain, and Gastrointestinal: positive for abdominal pain, diarrhea, nausea, and vomiting  Assessment & Plan: * C. difficile colitis - C. difficile testing positive on admission.  CT abdomen/pelvis shows mild diffuse colon wall thickening with mucosal enhancement.  Negative for perforation or abscess - Continue oral vancomycin.  Tentative plan is for 10-day course total -Monitor for worsening abdominal pain or any signs of ileus -Continue on fluids as she has inadequate ability to take in oral nutrition - CLD and we'll advance as able  Metastatic breast cancer ([place]) - s/p port placement on 8/1 and first chemo on  8/2 - follows with Dr. Wendee Beavers at [facility] - mets to T3, L4 and left sacrum; and right axillary node - regimen: pertuzumab, docetaxel, carboplatin, kanjinti  Pancytopenia ([place]) - expected 2/2 recent chemo on 8/2 - continue daily CBC; no indication for neutropenic precautions yet  Hypokalemia - replete and check as needed - 2/2 GI losses  Hyperlipidemia - Continue Crestor  Hypertension - Continue lisinopril-HCTZ    Old records reviewed in assessment of this patient  Antimicrobials: Vancomycin p.o. 01/03/21 >> current  DVT prophylaxis:    Code Status:   Code Status: Full Code Family Communication:   Disposition Plan: Status is: Observation  The patient will require care spanning > 2 midnights and should be moved to inpatient because: Persistent severe electrolyte disturbances, IV treatments appropriate due to intensity of illness or inability to take PO, Inpatient level of care appropriate due to severity of illness, and inability to tolerate oral nutrition and maintain adequate nutrition  Dispo: The patient is from: Home              Anticipated d/c is to: Home              Patient currently is not medically stable to d/c.   Difficult to place patient No  Risk of unplanned readmission score:     Objective: Blood pressure (!) 141/81, pulse 71, temperature 98.3 F (36.8 C), temperature source Oral, resp. rate 18, height '5\' 6"'$  (1.676 m), weight 104.5 kg, last menstrual period 02/20/2012, SpO2 94 %.  Examination: General appearance: alert, cooperative, fatigued, and no distress Head: Normocephalic, without obvious abnormality, atraumatic Eyes:  EOMI Lungs: clear to auscultation bilaterally Heart: regular rate and rhythm and S1, S2 normal Abdomen:  Tender throughout nonspecifically, no rebound or guarding.  Bowel sounds present. Extremities:  No edema Skin: mobility and turgor normal Neurologic: Grossly normal  Consultants:    Procedures:    Data Reviewed: I have  personally reviewed following labs and imaging studies Results for orders placed or performed during the hospital encounter of 01/03/21 (from the past 24 hour(s))  C Difficile Quick Screen w PCR reflex     Status: Abnormal   Collection Time: 01/03/21  1:58 PM   Specimen: Stool  Result Value Ref Range   C Diff antigen POSITIVE (A) NEGATIVE   C Diff toxin POSITIVE (A) NEGATIVE   C Diff interpretation Toxin producing C. difficile detected.   CBC with Differential     Status: Abnormal   Collection Time: 01/03/21  2:28 PM  Result Value Ref Range   WBC 2.8 (L) 4.0 - 10.5 K/uL   RBC 5.22 (H) 3.87 - 5.11 MIL/uL   Hemoglobin 14.1 12.0 - 15.0 g/dL   HCT 42.7 36.0 - 46.0 %   MCV 81.8 80.0 - 100.0 fL   MCH 27.0 26.0 - 34.0 pg   MCHC 33.0 30.0 - 36.0 g/dL   RDW 13.5 11.5 - 15.5 %   Platelets 145 (L) 150 - 400 K/uL   nRBC 0.0 0.0 - 0.2 %   Neutrophils Relative % 61 %   Neutro Abs 1.7 1.7 - 7.7 K/uL   Lymphocytes Relative 36 %   Lymphs Abs 1.0 0.7 - 4.0 K/uL   Monocytes Relative 3 %   Monocytes Absolute 0.1 0.1 - 1.0 K/uL   Eosinophils Relative 0 %   Eosinophils Absolute 0.0 0.0 - 0.5 K/uL   Basophils Relative 0 %   Basophils Absolute 0.0 0.0 - 0.1 K/uL   Abs Immature Granulocytes 0.00 0.00 - 0.07 K/uL   Reactive, Benign Lymphocytes PRESENT   Comprehensive metabolic panel     Status: Abnormal   Collection Time: 01/03/21  2:28 PM  Result Value Ref Range   Sodium 136 135 - 145 mmol/L   Potassium 3.7 3.5 - 5.1 mmol/L   Chloride 99 98 - 111 mmol/L   CO2 26 22 - 32 mmol/L   Glucose, Bld 116 (H) 70 - 99 mg/dL   BUN 22 (H) 6 - 20 mg/dL   Creatinine, Ser 0.93 0.44 - 1.00 mg/dL   Calcium 9.3 8.9 - 10.3 mg/dL   Total Protein 7.8 6.5 - 8.1 g/dL   Albumin 4.1 3.5 - 5.0 g/dL   AST 40 15 - 41 U/L   ALT 55 (H) 0 - 44 U/L   Alkaline Phosphatase 89 38 - 126 U/L   Total Bilirubin 1.9 (H) 0.3 - 1.2 mg/dL   GFR, Estimated >60 >60 mL/min   Anion gap 11 5 - 15  Lipase, blood     Status: None    Collection Time: 01/03/21  2:28 PM  Result Value Ref Range   Lipase 27 11 - 51 U/L  Urinalysis, Routine w reflex microscopic Urine, Clean Catch     Status: Abnormal   Collection Time: 01/03/21  5:20 PM  Result Value Ref Range   Color, Urine YELLOW YELLOW   APPearance HAZY (A) CLEAR   Specific Gravity, Urine 1.021 1.005 - 1.030   pH 6.0 5.0 - 8.0   Glucose, UA 50 (A) NEGATIVE mg/dL   Hgb urine dipstick NEGATIVE NEGATIVE   Bilirubin Urine  NEGATIVE NEGATIVE   Ketones, ur 5 (A) NEGATIVE mg/dL   Protein, ur 30 (A) NEGATIVE mg/dL   Nitrite NEGATIVE NEGATIVE   Leukocytes,Ua NEGATIVE NEGATIVE   RBC / HPF 0-5 0 - 5 RBC/hpf   Bacteria, UA RARE (A) NONE SEEN   Squamous Epithelial / LPF 0-5 0 - 5   Mucus PRESENT   Resp Panel by RT-PCR (Flu A&B, Covid) Nasopharyngeal Swab     Status: None   Collection Time: 01/03/21  7:56 PM   Specimen: Nasopharyngeal Swab; Nasopharyngeal(NP) swabs in vial transport medium  Result Value Ref Range   SARS Coronavirus 2 by RT PCR NEGATIVE NEGATIVE   Influenza A by PCR NEGATIVE NEGATIVE   Influenza B by PCR NEGATIVE NEGATIVE  CBC     Status: Abnormal   Collection Time: 01/04/21  5:15 AM  Result Value Ref Range   WBC 1.8 (L) 4.0 - 10.5 K/uL   RBC 4.46 3.87 - 5.11 MIL/uL   Hemoglobin 11.8 (L) 12.0 - 15.0 g/dL   HCT 37.6 36.0 - 46.0 %   MCV 84.3 80.0 - 100.0 fL   MCH 26.5 26.0 - 34.0 pg   MCHC 31.4 30.0 - 36.0 g/dL   RDW 13.6 11.5 - 15.5 %   Platelets 106 (L) 150 - 400 K/uL   nRBC 0.0 0.0 - 0.2 %  Basic metabolic panel     Status: Abnormal   Collection Time: 01/04/21  5:15 AM  Result Value Ref Range   Sodium 135 135 - 145 mmol/L   Potassium 3.1 (L) 3.5 - 5.1 mmol/L   Chloride 101 98 - 111 mmol/L   CO2 27 22 - 32 mmol/L   Glucose, Bld 102 (H) 70 - 99 mg/dL   BUN 16 6 - 20 mg/dL   Creatinine, Ser 0.82 0.44 - 1.00 mg/dL   Calcium 8.2 (L) 8.9 - 10.3 mg/dL   GFR, Estimated >60 >60 mL/min   Anion gap 7 5 - 15  Magnesium     Status: None   Collection  Time: 01/04/21  5:15 AM  Result Value Ref Range   Magnesium 2.2 1.7 - 2.4 mg/dL    Recent Results (from the past 240 hour(s))  C Difficile Quick Screen w PCR reflex     Status: Abnormal   Collection Time: 01/03/21  1:58 PM   Specimen: Stool  Result Value Ref Range Status   C Diff antigen POSITIVE (A) NEGATIVE Final   C Diff toxin POSITIVE (A) NEGATIVE Final   C Diff interpretation Toxin producing C. difficile detected.  Final    Comment: CRITICAL RESULT CALLED TO, READ BACK BY AND VERIFIED WITH: HICKS,W RN '@1946'$  ON 01/03/21 JACKSON,K Performed at [facility], [place] [address], [place], [place] [address]   Resp Panel by RT-PCR (Flu A&B, Covid) Nasopharyngeal Swab     Status: None   Collection Time: 01/03/21  7:56 PM   Specimen: Nasopharyngeal Swab; Nasopharyngeal(NP) swabs in vial transport medium  Result Value Ref Range Status   SARS Coronavirus 2 by RT PCR NEGATIVE NEGATIVE Final    Comment: (NOTE) SARS-CoV-2 target nucleic acids are NOT DETECTED.  The SARS-CoV-2 RNA is generally detectable in upper respiratory specimens during the acute phase of infection. The lowest concentration of SARS-CoV-2 viral copies this assay can detect is 138 copies/mL. A negative result does not preclude SARS-Cov-2 infection and should not be used as the sole basis for treatment or other patient management decisions. A negative result may occur with  improper specimen collection/handling, submission of specimen other than nasopharyngeal swab, presence of viral mutation(s) within the areas targeted by this assay, and inadequate number of viral copies(<138 copies/mL). A negative result must be combined with clinical observations, patient history, and epidemiological information. The expected result is Negative.  Fact Sheet for Patients:  EntrepreneurPulse.com.au  Fact Sheet for Healthcare Providers:  IncredibleEmployment.be  This test is no t yet  approved or cleared by the [place] FDA and  has been authorized for detection and/or diagnosis of SARS-CoV-2 by FDA under an Emergency Use Authorization (EUA). This EUA will remain  in effect (meaning this test can be used) for the duration of the COVID-19 declaration under Section 564(b)(1) of the Act, 21 U.S.C.section 360bbb-3(b)(1), unless the authorization is terminated  or revoked sooner.       Influenza A by PCR NEGATIVE NEGATIVE Final   Influenza B by PCR NEGATIVE NEGATIVE Final    Comment: (NOTE) The Xpert Xpress SARS-CoV-2/FLU/RSV plus assay is intended as an aid in the diagnosis of influenza from Nasopharyngeal swab specimens and should not be used as a sole basis for treatment. Nasal washings and aspirates are unacceptable for Xpert Xpress SARS-CoV-2/FLU/RSV testing.  Fact Sheet for Patients: EntrepreneurPulse.com.au  Fact Sheet for Healthcare Providers: IncredibleEmployment.be  This test is not yet approved or cleared by the [place] FDA and has been authorized for detection and/or diagnosis of SARS-CoV-2 by FDA under an Emergency Use Authorization (EUA). This EUA will remain in effect (meaning this test can be used) for the duration of the COVID-19 declaration under Section 564(b)(1) of the Act, 21 U.S.C. section 360bbb-3(b)(1), unless the authorization is terminated or revoked.  Performed at [facility], [place] [address] [place], [place] [address]      Radiology Studies: CT ABDOMEN PELVIS W CONTRAST  Result Date: 01/03/2021 CLINICAL DATA:  Diarrhea abdominal pain EXAM: CT ABDOMEN AND PELVIS WITH CONTRAST TECHNIQUE: Multidetector CT imaging of the abdomen and pelvis was performed using the standard protocol following bolus administration of intravenous contrast. CONTRAST:  64m OMNIPAQUE IOHEXOL 350 MG/ML SOLN COMPARISON:  CT 12/21/2020 FINDINGS: Lower chest: Lung bases demonstrate no acute  consolidation or effusion. Cardiac size upper limits of normal. Hepatobiliary: No focal liver abnormality is seen. Status post cholecystectomy. No biliary dilatation. Pancreas: Unremarkable. No pancreatic ductal dilatation or surrounding inflammatory changes. Spleen: Normal in size without focal abnormality. Adrenals/Urinary Tract: Adrenal glands are unremarkable. Kidneys are normal, without renal calculi, focal lesion, or hydronephrosis. Bladder is unremarkable. Stomach/Bowel: The stomach is nonenlarged. No dilated small bowel. Mild diffuse colon wall thickening with mucosal enhancement consistent with colitis. Negative appendix. Vascular/Lymphatic: Nonaneurysmal aorta. Similar right cardio phrenic fat pad lymph node. No increasing adenopathy. Reproductive: Status post hysterectomy. No adnexal masses. Other: Negative for free air or free fluid. Small fat containing umbilical hernia Musculoskeletal: No acute osseous abnormality IMPRESSION: 1. Interim development of mild diffuse colon wall thickening with mucosal enhancement consistent with colitis of infectious or inflammatory etiology, to include C difficile. Negative for perforation or abscess. Electronically Signed   By: KDonavan FoilM.D.   On: 01/03/2021 19:25   CT ABDOMEN PELVIS W CONTRAST  Final Result      Scheduled Meds:  enoxaparin (LOVENOX) injection  50 mg Subcutaneous Q24H   lisinopril  20 mg Oral Daily   And   hydrochlorothiazide  25 mg Oral Daily   rosuvastatin  10 mg Oral QHS   vancomycin  125 mg Oral QID   PRN Meds: acetaminophen **OR**  acetaminophen, ondansetron **OR** ondansetron (ZOFRAN) IV Continuous Infusions:  sodium chloride 75 mL/hr at 01/04/21 1200   potassium chloride 10 mEq (01/04/21 1158)     LOS: 0 days  Time spent: Greater than 50% of the 35 minute visit was spent in counseling/coordination of care for the patient as laid out in the A&P.   Dwyane Dee, MD [facility] 01/04/2021, 12:25 PM

## 2021-01-04 NOTE — Assessment & Plan Note (Signed)
-   Continue lisinopril HCTZ 

## 2021-01-04 NOTE — Hospital Course (Addendum)
[name] [name] is a 55 yo female with PMH recent diagnosis Stage IV right breast cancer with mets to right axillary node and bone (s/p 1st chemo on 12/29/20) who presented with N/V, abd pain, and diarrhea.  She underwent right chest PAC placement on 8/1 for chemo. She then underwent 1st chemo session on 12/29/20.   She underwent work-up for the diarrhea on admission including C. difficile testing which was positive.  CT abdomen/pelvis was then obtained which showed mild diffuse colon wall thickening with mucosal enhancement consistent with colitis.  Negative for perforation or abscess. She was admitted, started on oral vancomycin and fluids due to inability for adequate oral intake.

## 2021-01-04 NOTE — Assessment & Plan Note (Addendum)
-   C. difficile testing positive on admission.  CT abdomen/pelvis shows mild diffuse colon wall thickening with mucosal enhancement.  Negative for perforation or abscess - Continue oral vancomycin.  Tentative plan is for 10-day course total -Monitor for worsening abdominal pain or any signs of ileus -Continue on fluids as she has inadequate ability to take in oral nutrition; no improvement on 01/05/2021, continue fluids - CLD and we'll advance as able

## 2021-01-04 NOTE — Assessment & Plan Note (Signed)
-   expected 2/2 recent chemo on 8/2 - continue daily CBC; no indication for neutropenic precautions yet

## 2021-01-05 LAB — COMPREHENSIVE METABOLIC PANEL
ALT: 61 U/L — ABNORMAL HIGH (ref 0–44)
AST: 36 U/L (ref 15–41)
Albumin: 3.5 g/dL (ref 3.5–5.0)
Alkaline Phosphatase: 80 U/L (ref 38–126)
Anion gap: 11 (ref 5–15)
BUN: 8 mg/dL (ref 6–20)
CO2: 28 mmol/L (ref 22–32)
Calcium: 9.1 mg/dL (ref 8.9–10.3)
Chloride: 99 mmol/L (ref 98–111)
Creatinine, Ser: 0.69 mg/dL (ref 0.44–1.00)
GFR, Estimated: >60 mL/min (ref >60)
Glucose, Bld: 101 mg/dL — ABNORMAL HIGH (ref 70–99)
Potassium: 3.5 mmol/L (ref 3.5–5.1)
Sodium: 138 mmol/L (ref 135–145)
Total Bilirubin: 1.3 mg/dL — ABNORMAL HIGH (ref 0.3–1.2)
Total Protein: 6.5 g/dL (ref 6.5–8.1)

## 2021-01-05 LAB — CBC WITH DIFFERENTIAL/PLATELET
Abs Immature Granulocytes: 0.01 10*3/uL (ref 0.00–0.07)
Basophils Absolute: 0 10*3/uL (ref 0.0–0.1)
Basophils Relative: 1 %
Eosinophils Absolute: 0.1 10*3/uL (ref 0.0–0.5)
Eosinophils Relative: 2 %
HCT: 39.2 % (ref 36.0–46.0)
Hemoglobin: 12.7 g/dL (ref 12.0–15.0)
Immature Granulocytes: 0 %
Lymphocytes Relative: 16 %
Lymphs Abs: 0.4 10*3/uL — ABNORMAL LOW (ref 0.7–4.0)
MCH: 27.2 pg (ref 26.0–34.0)
MCHC: 32.4 g/dL (ref 30.0–36.0)
MCV: 83.9 fL (ref 80.0–100.0)
Monocytes Absolute: 1.2 10*3/uL — ABNORMAL HIGH (ref 0.1–1.0)
Monocytes Relative: 43 %
Neutro Abs: 1 10*3/uL — ABNORMAL LOW (ref 1.7–7.7)
Neutrophils Relative %: 38 %
Platelets: 102 10*3/uL — ABNORMAL LOW (ref 150–400)
RBC: 4.67 MIL/uL (ref 3.87–5.11)
RDW: 13.5 % (ref 11.5–15.5)
WBC: 2.7 10*3/uL — ABNORMAL LOW (ref 4.0–10.5)
nRBC: 0 % (ref 0.0–0.2)

## 2021-01-05 LAB — MAGNESIUM: Magnesium: 1.9 mg/dL (ref 1.7–2.4)

## 2021-01-05 MED ORDER — POTASSIUM CHLORIDE 10 MEQ/100ML IV SOLN
10.0000 meq | INTRAVENOUS | Status: AC
  Administered 2021-01-05 (×3): 10 meq via INTRAVENOUS
  Filled 2021-01-05 (×4): qty 100

## 2021-01-05 MED ORDER — MAGNESIUM SULFATE 2 GM/50ML IV SOLN
2.0000 g | Freq: Once | INTRAVENOUS | Status: AC
  Administered 2021-01-05: 2 g via INTRAVENOUS
  Filled 2021-01-05: qty 50

## 2021-01-05 MED ORDER — FENTANYL CITRATE (PF) 100 MCG/2ML IJ SOLN
25.0000 ug | INTRAMUSCULAR | Status: DC | PRN
Start: 2021-01-05 — End: 2021-01-09
  Administered 2021-01-05 (×2): 25 ug via INTRAVENOUS
  Filled 2021-01-05 (×2): qty 2

## 2021-01-05 MED ORDER — POTASSIUM CHLORIDE 10 MEQ/100ML IV SOLN
INTRAVENOUS | Status: AC
  Administered 2021-01-05: 10 meq via INTRAVENOUS
  Filled 2021-01-05: qty 100

## 2021-01-05 NOTE — Progress Notes (Signed)
Progress Note    [name] [name]   [id number]  DOB: [birth year]  DOA: 01/03/2021     1  PCP: System, Provider Not In  Initial CC: N/V/D, abd pain  Hospital Course: [name] [name] is a 55 yo female with PMH recent diagnosis Stage IV right breast cancer with mets to right axillary node and bone (s/p 1st chemo on 12/29/20) who presented with N/V, abd pain, and diarrhea.  She underwent right chest PAC placement on 8/1 for chemo. She then underwent 1st chemo session on 12/29/20.   She underwent work-up for the diarrhea on admission including C. difficile testing which was positive.  CT abdomen/pelvis was then obtained which showed mild diffuse colon wall thickening with mucosal enhancement consistent with colitis.  Negative for perforation or abscess. She was admitted, started on oral vancomycin and fluids due to inability for adequate oral intake.  Interval History:  Not much improvement since yesterday.  This morning was her first attempt at clear liquids and she did not tolerate well which caused worsened increase in her abdominal cramping/pain. Plan is to continue IV hydration with fluids and trying to advance diet as she tolerates.  ROS: Constitutional: positive for fatigue and malaise, negative for chills and fevers, Respiratory: negative for cough and sputum, Cardiovascular: negative for chest pain, and Gastrointestinal: positive for abdominal pain, diarrhea, nausea, and vomiting  Assessment & Plan: * C. difficile colitis - C. difficile testing positive on admission.  CT abdomen/pelvis shows mild diffuse colon wall thickening with mucosal enhancement.  Negative for perforation or abscess - Continue oral vancomycin.  Tentative plan is for 10-day course total -Monitor for worsening abdominal pain or any signs of ileus -Continue on fluids as she has inadequate ability to take in oral nutrition; no improvement on 01/05/2021, continue fluids - CLD and we'll advance as able  Metastatic breast  cancer ([place]) - s/p port placement on 8/1 and first chemo on 8/2 - follows with Dr. Wendee Beavers at [facility] - mets to T3, L4 and left sacrum; and right axillary node - regimen: pertuzumab, docetaxel, carboplatin, kanjinti  Pancytopenia ([place]) - expected 2/2 recent chemo on 8/2 - continue daily CBC; no indication for neutropenic precautions yet  Hypokalemia - replete and check as needed - 2/2 GI losses  Hyperlipidemia - Continue Crestor  Hypertension - Continue lisinopril-HCTZ   Old records reviewed in assessment of this patient  Antimicrobials: Vancomycin p.o. 01/03/21 >> current  DVT prophylaxis:    Code Status:   Code Status: Full Code Family Communication:   Disposition Plan: Status is: Inpatient  Remains inpatient appropriate because:IV treatments appropriate due to intensity of illness or inability to take PO and Inpatient level of care appropriate due to severity of illness  Dispo: The patient is from: Home              Anticipated d/c is to: Home              Patient currently is not medically stable to d/c.   Difficult to place patient No  Risk of unplanned readmission score: Unplanned Admission- Pilot do not use: 8.22   Objective: Blood pressure 120/67, pulse 69, temperature 98.5 F (36.9 C), temperature source Oral, resp. rate 17, height '5\' 6"'$  (1.676 m), weight 104.5 kg, last menstrual period 02/20/2012, SpO2 95 %.  Examination: General appearance: alert, cooperative, fatigued, and no distress Head: Normocephalic, without obvious abnormality, atraumatic Eyes:  EOMI Lungs: clear to auscultation bilaterally Heart: regular rate and rhythm and S1, S2 normal Abdomen:  Tender throughout nonspecifically, no rebound or guarding.  Bowel sounds present. Extremities:  No edema Skin: mobility and turgor normal Neurologic: Grossly normal  Consultants:    Procedures:    Data Reviewed: I have personally reviewed following labs and imaging studies Results for orders  placed or performed during the hospital encounter of 01/03/21 (from the past 24 hour(s))  CBC with Differential/Platelet     Status: Abnormal   Collection Time: 01/05/21  5:13 AM  Result Value Ref Range   WBC 2.7 (L) 4.0 - 10.5 K/uL   RBC 4.67 3.87 - 5.11 MIL/uL   Hemoglobin 12.7 12.0 - 15.0 g/dL   HCT 39.2 36.0 - 46.0 %   MCV 83.9 80.0 - 100.0 fL   MCH 27.2 26.0 - 34.0 pg   MCHC 32.4 30.0 - 36.0 g/dL   RDW 13.5 11.5 - 15.5 %   Platelets 102 (L) 150 - 400 K/uL   nRBC 0.0 0.0 - 0.2 %   Neutrophils Relative % 38 %   Neutro Abs 1.0 (L) 1.7 - 7.7 K/uL   Lymphocytes Relative 16 %   Lymphs Abs 0.4 (L) 0.7 - 4.0 K/uL   Monocytes Relative 43 %   Monocytes Absolute 1.2 (H) 0.1 - 1.0 K/uL   Eosinophils Relative 2 %   Eosinophils Absolute 0.1 0.0 - 0.5 K/uL   Basophils Relative 1 %   Basophils Absolute 0.0 0.0 - 0.1 K/uL   Immature Granulocytes 0 %   Abs Immature Granulocytes 0.01 0.00 - 0.07 K/uL  Comprehensive metabolic panel     Status: Abnormal   Collection Time: 01/05/21  5:13 AM  Result Value Ref Range   Sodium 138 135 - 145 mmol/L   Potassium 3.5 3.5 - 5.1 mmol/L   Chloride 99 98 - 111 mmol/L   CO2 28 22 - 32 mmol/L   Glucose, Bld 101 (H) 70 - 99 mg/dL   BUN 8 6 - 20 mg/dL   Creatinine, Ser 0.69 0.44 - 1.00 mg/dL   Calcium 9.1 8.9 - 10.3 mg/dL   Total Protein 6.5 6.5 - 8.1 g/dL   Albumin 3.5 3.5 - 5.0 g/dL   AST 36 15 - 41 U/L   ALT 61 (H) 0 - 44 U/L   Alkaline Phosphatase 80 38 - 126 U/L   Total Bilirubin 1.3 (H) 0.3 - 1.2 mg/dL   GFR, Estimated >60 >60 mL/min   Anion gap 11 5 - 15  Magnesium     Status: None   Collection Time: 01/05/21  5:13 AM  Result Value Ref Range   Magnesium 1.9 1.7 - 2.4 mg/dL    Recent Results (from the past 240 hour(s))  C Difficile Quick Screen w PCR reflex     Status: Abnormal   Collection Time: 01/03/21  1:58 PM   Specimen: Stool  Result Value Ref Range Status   C Diff antigen POSITIVE (A) NEGATIVE Final   C Diff toxin POSITIVE (A)  NEGATIVE Final   C Diff interpretation Toxin producing C. difficile detected.  Final    Comment: CRITICAL RESULT CALLED TO, READ BACK BY AND VERIFIED WITH: HICKS,W RN '@1946'$  ON 01/03/21 JACKSON,K Performed at [facility], [place] [address], [place],  [address]   Gastrointestinal Panel by PCR , Stool     Status: None   Collection Time: 01/03/21  6:30 PM   Specimen: Stool  Result Value Ref Range Status   Campylobacter species NOT DETECTED NOT DETECTED Final   Plesimonas shigelloides NOT  DETECTED NOT DETECTED Final   Salmonella species NOT DETECTED NOT DETECTED Final   Yersinia enterocolitica NOT DETECTED NOT DETECTED Final   Vibrio species NOT DETECTED NOT DETECTED Final   Vibrio cholerae NOT DETECTED NOT DETECTED Final   Enteroaggregative E coli (EAEC) NOT DETECTED NOT DETECTED Final   Enteropathogenic E coli (EPEC) NOT DETECTED NOT DETECTED Final   Enterotoxigenic E coli (ETEC) NOT DETECTED NOT DETECTED Final   Shiga like toxin producing E coli (STEC) NOT DETECTED NOT DETECTED Final   Shigella/Enteroinvasive E coli (EIEC) NOT DETECTED NOT DETECTED Final   Cryptosporidium NOT DETECTED NOT DETECTED Final   Cyclospora cayetanensis NOT DETECTED NOT DETECTED Final   Entamoeba histolytica NOT DETECTED NOT DETECTED Final   Giardia lamblia NOT DETECTED NOT DETECTED Final   Adenovirus F40/41 NOT DETECTED NOT DETECTED Final   Astrovirus NOT DETECTED NOT DETECTED Final   Norovirus GI/GII NOT DETECTED NOT DETECTED Final   Rotavirus A NOT DETECTED NOT DETECTED Final   Sapovirus (I, II, IV, and V) NOT DETECTED NOT DETECTED Final    Comment: Performed at [facility], [address] [place], [place] [address]  Resp Panel by RT-PCR (Flu A&B, Covid) Nasopharyngeal Swab     Status: None   Collection Time: 01/03/21  7:56 PM   Specimen: Nasopharyngeal Swab; Nasopharyngeal(NP) swabs in vial transport medium  Result Value Ref Range Status   SARS Coronavirus 2 by RT PCR  NEGATIVE NEGATIVE Final    Comment: (NOTE) SARS-CoV-2 target nucleic acids are NOT DETECTED.  The SARS-CoV-2 RNA is generally detectable in upper respiratory specimens during the acute phase of infection. The lowest concentration of SARS-CoV-2 viral copies this assay can detect is 138 copies/mL. A negative result does not preclude SARS-Cov-2 infection and should not be used as the sole basis for treatment or other patient management decisions. A negative result may occur with  improper specimen collection/handling, submission of specimen other than nasopharyngeal swab, presence of viral mutation(s) within the areas targeted by this assay, and inadequate number of viral copies(<138 copies/mL). A negative result must be combined with clinical observations, patient history, and epidemiological information. The expected result is Negative.  Fact Sheet for Patients:  EntrepreneurPulse.com.au  Fact Sheet for Healthcare Providers:  IncredibleEmployment.be  This test is no t yet approved or cleared by the [place] FDA and  has been authorized for detection and/or diagnosis of SARS-CoV-2 by FDA under an Emergency Use Authorization (EUA). This EUA will remain  in effect (meaning this test can be used) for the duration of the COVID-19 declaration under Section 564(b)(1) of the Act, 21 U.S.C.section 360bbb-3(b)(1), unless the authorization is terminated  or revoked sooner.       Influenza A by PCR NEGATIVE NEGATIVE Final   Influenza B by PCR NEGATIVE NEGATIVE Final    Comment: (NOTE) The Xpert Xpress SARS-CoV-2/FLU/RSV plus assay is intended as an aid in the diagnosis of influenza from Nasopharyngeal swab specimens and should not be used as a sole basis for treatment. Nasal washings and aspirates are unacceptable for Xpert Xpress SARS-CoV-2/FLU/RSV testing.  Fact Sheet for Patients: EntrepreneurPulse.com.au  Fact Sheet for  Healthcare Providers: IncredibleEmployment.be  This test is not yet approved or cleared by the [place] FDA and has been authorized for detection and/or diagnosis of SARS-CoV-2 by FDA under an Emergency Use Authorization (EUA). This EUA will remain in effect (meaning this test can be used) for the duration of the COVID-19 declaration under Section 564(b)(1) of the Act, 21 U.S.C.  section 360bbb-3(b)(1), unless the authorization is terminated or revoked.  Performed at [facility], [place] [address] [place], [place] [address]      Radiology Studies: CT ABDOMEN PELVIS W CONTRAST  Result Date: 01/03/2021 CLINICAL DATA:  Diarrhea abdominal pain EXAM: CT ABDOMEN AND PELVIS WITH CONTRAST TECHNIQUE: Multidetector CT imaging of the abdomen and pelvis was performed using the standard protocol following bolus administration of intravenous contrast. CONTRAST:  78m OMNIPAQUE IOHEXOL 350 MG/ML SOLN COMPARISON:  CT 12/21/2020 FINDINGS: Lower chest: Lung bases demonstrate no acute consolidation or effusion. Cardiac size upper limits of normal. Hepatobiliary: No focal liver abnormality is seen. Status post cholecystectomy. No biliary dilatation. Pancreas: Unremarkable. No pancreatic ductal dilatation or surrounding inflammatory changes. Spleen: Normal in size without focal abnormality. Adrenals/Urinary Tract: Adrenal glands are unremarkable. Kidneys are normal, without renal calculi, focal lesion, or hydronephrosis. Bladder is unremarkable. Stomach/Bowel: The stomach is nonenlarged. No dilated small bowel. Mild diffuse colon wall thickening with mucosal enhancement consistent with colitis. Negative appendix. Vascular/Lymphatic: Nonaneurysmal aorta. Similar right cardio phrenic fat pad lymph node. No increasing adenopathy. Reproductive: Status post hysterectomy. No adnexal masses. Other: Negative for free air or free fluid. Small fat containing umbilical hernia  Musculoskeletal: No acute osseous abnormality IMPRESSION: 1. Interim development of mild diffuse colon wall thickening with mucosal enhancement consistent with colitis of infectious or inflammatory etiology, to include C difficile. Negative for perforation or abscess. Electronically Signed   By: KDonavan FoilM.D.   On: 01/03/2021 19:25   CT ABDOMEN PELVIS W CONTRAST  Final Result      Scheduled Meds:  enoxaparin (LOVENOX) injection  50 mg Subcutaneous Q24H   lisinopril  20 mg Oral Daily   And   hydrochlorothiazide  25 mg Oral Daily   rosuvastatin  10 mg Oral QHS   vancomycin  125 mg Oral QID   PRN Meds: acetaminophen **OR** acetaminophen, alum & mag hydroxide-simeth, fentaNYL (SUBLIMAZE) injection, ondansetron **OR** ondansetron (ZOFRAN) IV Continuous Infusions:  sodium chloride 75 mL/hr at 01/05/21 0322     LOS: 1 day  Time spent: Greater than 50% of the 35 minute visit was spent in counseling/coordination of care for the patient as laid out in the A&P.   DDwyane Dee MD [facility] 01/05/2021, 3:31 PM

## 2021-01-05 NOTE — Progress Notes (Signed)
Patient called this RN to complain of burning sensation at IV site of where potassium was running. This RN slowed infusion down. Upon completion of the 2nd bag patient is asking this RN if we can take a break from the potassium infusions and start later. RN made MD aware of situation. RN has asked patient multiple times of restarting the last 2 potassium runs she needs and she continues to say later. Will ask patient again later.

## 2021-01-06 DIAGNOSIS — C50919 Malignant neoplasm of unspecified site of unspecified female breast: Secondary | ICD-10-CM

## 2021-01-06 DIAGNOSIS — Z9221 Personal history of antineoplastic chemotherapy: Secondary | ICD-10-CM

## 2021-01-06 DIAGNOSIS — I10 Essential (primary) hypertension: Secondary | ICD-10-CM

## 2021-01-06 LAB — CBC WITH DIFFERENTIAL/PLATELET
Abs Immature Granulocytes: 0.1 10*3/uL — ABNORMAL HIGH (ref 0.00–0.07)
Basophils Absolute: 0 10*3/uL (ref 0.0–0.1)
Basophils Relative: 0 %
Eosinophils Absolute: 0.1 10*3/uL (ref 0.0–0.5)
Eosinophils Relative: 1 %
HCT: 38.2 % (ref 36.0–46.0)
Hemoglobin: 12.4 g/dL (ref 12.0–15.0)
Immature Granulocytes: 2 %
Lymphocytes Relative: 14 %
Lymphs Abs: 0.9 10*3/uL (ref 0.7–4.0)
MCH: 27.3 pg (ref 26.0–34.0)
MCHC: 32.5 g/dL (ref 30.0–36.0)
MCV: 84 fL (ref 80.0–100.0)
Monocytes Absolute: 1.5 10*3/uL — ABNORMAL HIGH (ref 0.1–1.0)
Monocytes Relative: 23 %
Neutro Abs: 3.8 10*3/uL (ref 1.7–7.7)
Neutrophils Relative %: 60 %
Platelets: 116 10*3/uL — ABNORMAL LOW (ref 150–400)
RBC: 4.55 MIL/uL (ref 3.87–5.11)
RDW: 13.5 % (ref 11.5–15.5)
WBC: 6.3 10*3/uL (ref 4.0–10.5)
nRBC: 0 % (ref 0.0–0.2)

## 2021-01-06 LAB — MAGNESIUM: Magnesium: 2 mg/dL (ref 1.7–2.4)

## 2021-01-06 LAB — COMPREHENSIVE METABOLIC PANEL
ALT: 64 U/L — ABNORMAL HIGH (ref 0–44)
AST: 35 U/L (ref 15–41)
Albumin: 3.5 g/dL (ref 3.5–5.0)
Alkaline Phosphatase: 79 U/L (ref 38–126)
Anion gap: 11 (ref 5–15)
BUN: 9 mg/dL (ref 6–20)
CO2: 27 mmol/L (ref 22–32)
Calcium: 8.9 mg/dL (ref 8.9–10.3)
Chloride: 99 mmol/L (ref 98–111)
Creatinine, Ser: 0.69 mg/dL (ref 0.44–1.00)
GFR, Estimated: >60 mL/min (ref >60)
Glucose, Bld: 92 mg/dL (ref 70–99)
Potassium: 3.8 mmol/L (ref 3.5–5.1)
Sodium: 137 mmol/L (ref 135–145)
Total Bilirubin: 0.7 mg/dL (ref 0.3–1.2)
Total Protein: 6.5 g/dL (ref 6.5–8.1)

## 2021-01-06 MED ORDER — CHLORHEXIDINE GLUCONATE CLOTH 2 % EX PADS
6.0000 | MEDICATED_PAD | Freq: Every day | CUTANEOUS | Status: DC
  Administered 2021-01-06 – 2021-01-09 (×4): 6 via TOPICAL

## 2021-01-06 MED ORDER — VANCOMYCIN HCL 250 MG PO CAPS
500.0000 mg | ORAL_CAPSULE | Freq: Four times a day (QID) | ORAL | Status: DC
  Administered 2021-01-06 – 2021-01-07 (×7): 500 mg via ORAL
  Filled 2021-01-06 (×9): qty 2

## 2021-01-06 NOTE — Progress Notes (Signed)
[facility] PROGRESS NOTE    Progress Note  [name] [name]  [id number] DOB: 1966-04-19 DOA: 01/03/2021 PCP: System, Provider Not In     Brief Narrative:   [name] [name] is an 55 y.o. female past medical history of stage IV right breast cancer with metastatic right axillary node and bone metastases on chemotherapy presented with nausea vomiting abdominal pain as well as diarrhea.  Underwent Port-A-Cath placement on 12/29/2018 21st chemo on 12/29/2020 who comes in complaining of nausea vomiting abdominal pain and diarrhea C. difficile negative on admission CT scan of the abdomen pelvis showed mild diffuse colitis with wall thickening negative for perforation or abscess.    Assessment/Plan:   Abdominal pain nausea and vomiting likely due to   C. difficile colitis: C. difficile positive started on oral vancomycin tentatively for 10 days. Continue IV fluids until stools more formed unable to tolerate oral hydration.  Metastatic breast cancer: Last chemotherapy 12/29/2020. Port-A-Cath placed on 12/28/2020.  Pancytopenia: As expected last chemotherapy on 12/29/2020. Now improved.  Hypokalemia: Replete orally now improved likely due to GI losses.   Medical Continue Crestor.  Essential hypertension: Continue current regimen.  DVT prophylaxis: lovenox Family Communication:none Status is: Inpatient  Remains inpatient appropriate because:Hemodynamically unstable  Dispo: The patient is from: Home              Anticipated d/c is to: Home              Patient currently is not medically stable to d/c.   Difficult to place patient No   Code Status:     Code Status Orders  (From admission, onward)           Start     Ordered   01/03/21 2118  Full code  Continuous        01/03/21 2119           Code Status History     Date Active Date Inactive Code Status Order ID Comments User Context   04/20/2013 0213 04/21/2013 1943 Full Code [id number]  Berle Mull, MD  Inpatient   02/20/2012 1723 02/22/2012 1723 Full Code [id number]  Lawernce Ion, RN Inpatient   11/12/2011 1538 11/16/2011 2158 Full Code [id number]  Theodis Blaze, MD ED         IV Access:   Peripheral IV   Procedures and diagnostic studies:   No results found.   Medical Consultants:   None.   Subjective:    [name] she relates her abdominal pain is better but continues to have more than 6 watery bowel movements a day  Objective:    Vitals:   01/05/21 0508 01/05/21 1422 01/05/21 2020 01/06/21 0610  BP: 119/63 120/67 (!) 122/92 (!) 110/59  Pulse: 79 69 76 73  Resp: '14 17 18 18  '$ Temp: 98.6 F (37 C) 98.5 F (36.9 C) 98.3 F (36.8 C) 98.8 F (37.1 C)  TempSrc: Oral Oral Oral Oral  SpO2: 94% 95% 99% 93%  Weight:      Height:       SpO2: 93 %   Intake/Output Summary (Last 24 hours) at 01/06/2021 1138 Last data filed at 01/05/2021 1759 Gross per 24 hour  Intake 240 ml  Output --  Net 240 ml   Filed Weights   01/03/21 2210  Weight: 104.5 kg    Exam: General exam: In no acute distress. Respiratory system: Good air movement and clear to auscultation. Cardiovascular system: S1 & S2 heard, RRR. No JVD. Gastrointestinal  system: Abdomen is nondistended, soft and nontender.  Extremities: No pedal edema. Skin: No rashes, lesions or ulcers Psychiatry: Judgement and insight appear normal. Mood & affect appropriate.    Data Reviewed:    Labs: Basic Metabolic Panel: Recent Labs  Lab 01/03/21 1428 01/04/21 0515 01/05/21 0513 01/06/21 0539  NA 136 135 138 137  K 3.7 3.1* 3.5 3.8  CL 99 101 99 99  CO2 '26 27 28 27  '$ GLUCOSE 116* 102* 101* 92  BUN 22* '16 8 9  '$ CREATININE 0.93 0.82 0.69 0.69  CALCIUM 9.3 8.2* 9.1 8.9  MG  --  2.2 1.9 2.0   GFR Estimated Creatinine Clearance: 98.2 mL/min (by C-G formula based on SCr of 0.69 mg/dL). Liver Function Tests: Recent Labs  Lab 01/03/21 1428 01/05/21 0513 01/06/21 0539  AST 40 36 35  ALT 55* 61*  64*  ALKPHOS 89 80 79  BILITOT 1.9* 1.3* 0.7  PROT 7.8 6.5 6.5  ALBUMIN 4.1 3.5 3.5   Recent Labs  Lab 01/03/21 1428  LIPASE 27   No results for input(s): AMMONIA in the last 168 hours. Coagulation profile No results for input(s): INR, PROTIME in the last 168 hours. COVID-19 Labs  No results for input(s): DDIMER, FERRITIN, LDH, CRP in the last 72 hours.  Lab Results  Component Value Date   [place] NEGATIVE 01/03/2021    CBC: Recent Labs  Lab 01/03/21 1428 01/04/21 0515 01/05/21 0513 01/06/21 0539  WBC 2.8* 1.8* 2.7* 6.3  NEUTROABS 1.7  --  1.0* 3.8  HGB 14.1 11.8* 12.7 12.4  HCT 42.7 37.6 39.2 38.2  MCV 81.8 84.3 83.9 84.0  PLT 145* 106* 102* 116*   Cardiac Enzymes: No results for input(s): CKTOTAL, CKMB, CKMBINDEX, TROPONINI in the last 168 hours. BNP (last 3 results) No results for input(s): PROBNP in the last 8760 hours. CBG: No results for input(s): GLUCAP in the last 168 hours. D-Dimer: No results for input(s): DDIMER in the last 72 hours. Hgb A1c: No results for input(s): HGBA1C in the last 72 hours. Lipid Profile: No results for input(s): CHOL, HDL, LDLCALC, TRIG, CHOLHDL, LDLDIRECT in the last 72 hours. Thyroid function studies: No results for input(s): TSH, T4TOTAL, T3FREE, THYROIDAB in the last 72 hours.  Invalid input(s): FREET3 Anemia work up: No results for input(s): VITAMINB12, FOLATE, FERRITIN, TIBC, IRON, RETICCTPCT in the last 72 hours. Sepsis Labs: Recent Labs  Lab 01/03/21 1428 01/04/21 0515 01/05/21 0513 01/06/21 0539  WBC 2.8* 1.8* 2.7* 6.3   Microbiology Recent Results (from the past 240 hour(s))  C Difficile Quick Screen w PCR reflex     Status: Abnormal   Collection Time: 01/03/21  1:58 PM   Specimen: Stool  Result Value Ref Range Status   C Diff antigen POSITIVE (A) NEGATIVE Final   C Diff toxin POSITIVE (A) NEGATIVE Final   C Diff interpretation Toxin producing C. difficile detected.  Final    Comment: CRITICAL  RESULT CALLED TO, READ BACK BY AND VERIFIED WITH: HICKS,W RN '@1946'$  ON 01/03/21 JACKSON,K Performed at [facility], [place] [address] [place],  [address]   Gastrointestinal Panel by PCR , Stool     Status: None   Collection Time: 01/03/21  6:30 PM   Specimen: Stool  Result Value Ref Range Status   Campylobacter species NOT DETECTED NOT DETECTED Final   Plesimonas shigelloides NOT DETECTED NOT DETECTED Final   Salmonella species NOT DETECTED NOT DETECTED Final   Yersinia enterocolitica NOT DETECTED NOT DETECTED  Final   Vibrio species NOT DETECTED NOT DETECTED Final   Vibrio cholerae NOT DETECTED NOT DETECTED Final   Enteroaggregative E coli (EAEC) NOT DETECTED NOT DETECTED Final   Enteropathogenic E coli (EPEC) NOT DETECTED NOT DETECTED Final   Enterotoxigenic E coli (ETEC) NOT DETECTED NOT DETECTED Final   Shiga like toxin producing E coli (STEC) NOT DETECTED NOT DETECTED Final   Shigella/Enteroinvasive E coli (EIEC) NOT DETECTED NOT DETECTED Final   Cryptosporidium NOT DETECTED NOT DETECTED Final   Cyclospora cayetanensis NOT DETECTED NOT DETECTED Final   Entamoeba histolytica NOT DETECTED NOT DETECTED Final   Giardia lamblia NOT DETECTED NOT DETECTED Final   Adenovirus F40/41 NOT DETECTED NOT DETECTED Final   Astrovirus NOT DETECTED NOT DETECTED Final   Norovirus GI/GII NOT DETECTED NOT DETECTED Final   Rotavirus A NOT DETECTED NOT DETECTED Final   Sapovirus (I, II, IV, and V) NOT DETECTED NOT DETECTED Final    Comment: Performed at [facility], [address] [place], [place] [address]  Resp Panel by RT-PCR (Flu A&B, Covid) Nasopharyngeal Swab     Status: None   Collection Time: 01/03/21  7:56 PM   Specimen: Nasopharyngeal Swab; Nasopharyngeal(NP) swabs in vial transport medium  Result Value Ref Range Status   SARS Coronavirus 2 by RT PCR NEGATIVE NEGATIVE Final    Comment: (NOTE) SARS-CoV-2 target nucleic acids are NOT DETECTED.  The  SARS-CoV-2 RNA is generally detectable in upper respiratory specimens during the acute phase of infection. The lowest concentration of SARS-CoV-2 viral copies this assay can detect is 138 copies/mL. A negative result does not preclude SARS-Cov-2 infection and should not be used as the sole basis for treatment or other patient management decisions. A negative result may occur with  improper specimen collection/handling, submission of specimen other than nasopharyngeal swab, presence of viral mutation(s) within the areas targeted by this assay, and inadequate number of viral copies(<138 copies/mL). A negative result must be combined with clinical observations, patient history, and epidemiological information. The expected result is Negative.  Fact Sheet for Patients:  EntrepreneurPulse.com.au  Fact Sheet for Healthcare Providers:  IncredibleEmployment.be  This test is no t yet approved or cleared by the [place] FDA and  has been authorized for detection and/or diagnosis of SARS-CoV-2 by FDA under an Emergency Use Authorization (EUA). This EUA will remain  in effect (meaning this test can be used) for the duration of the COVID-19 declaration under Section 564(b)(1) of the Act, 21 U.S.C.section 360bbb-3(b)(1), unless the authorization is terminated  or revoked sooner.       Influenza A by PCR NEGATIVE NEGATIVE Final   Influenza B by PCR NEGATIVE NEGATIVE Final    Comment: (NOTE) The Xpert Xpress SARS-CoV-2/FLU/RSV plus assay is intended as an aid in the diagnosis of influenza from Nasopharyngeal swab specimens and should not be used as a sole basis for treatment. Nasal washings and aspirates are unacceptable for Xpert Xpress SARS-CoV-2/FLU/RSV testing.  Fact Sheet for Patients: EntrepreneurPulse.com.au  Fact Sheet for Healthcare Providers: IncredibleEmployment.be  This test is not yet approved or  cleared by the [place] FDA and has been authorized for detection and/or diagnosis of SARS-CoV-2 by FDA under an Emergency Use Authorization (EUA). This EUA will remain in effect (meaning this test can be used) for the duration of the COVID-19 declaration under Section 564(b)(1) of the Act, 21 U.S.C. section 360bbb-3(b)(1), unless the authorization is terminated or revoked.  Performed at [facility], [address] [place],  [place] [address]      Medications:    Chlorhexidine Gluconate Cloth  6 each Topical Daily   enoxaparin (LOVENOX) injection  50 mg Subcutaneous Q24H   lisinopril  20 mg Oral Daily   And   hydrochlorothiazide  25 mg Oral Daily   rosuvastatin  10 mg Oral QHS   vancomycin  125 mg Oral QID   Continuous Infusions:  sodium chloride 75 mL/hr at 01/06/21 0649      LOS: 2 days   Charlynne Cousins  [facility]  01/06/2021, 11:38 AM

## 2021-01-07 LAB — CBC WITH DIFFERENTIAL/PLATELET
Abs Immature Granulocytes: 0.2 10*3/uL — ABNORMAL HIGH (ref 0.00–0.07)
Basophils Absolute: 0.1 10*3/uL (ref 0.0–0.1)
Basophils Relative: 1 %
Eosinophils Absolute: 0.1 10*3/uL (ref 0.0–0.5)
Eosinophils Relative: 1 %
HCT: 41.6 % (ref 36.0–46.0)
Hemoglobin: 13.2 g/dL (ref 12.0–15.0)
Immature Granulocytes: 2 %
Lymphocytes Relative: 10 %
Lymphs Abs: 0.9 10*3/uL (ref 0.7–4.0)
MCH: 26.9 pg (ref 26.0–34.0)
MCHC: 31.7 g/dL (ref 30.0–36.0)
MCV: 84.9 fL (ref 80.0–100.0)
Monocytes Absolute: 1.3 10*3/uL — ABNORMAL HIGH (ref 0.1–1.0)
Monocytes Relative: 13 %
Neutro Abs: 7.1 10*3/uL (ref 1.7–7.7)
Neutrophils Relative %: 73 %
Platelets: 120 10*3/uL — ABNORMAL LOW (ref 150–400)
RBC: 4.9 MIL/uL (ref 3.87–5.11)
RDW: 13.5 % (ref 11.5–15.5)
WBC: 9.6 10*3/uL (ref 4.0–10.5)
nRBC: 0 % (ref 0.0–0.2)

## 2021-01-07 LAB — COMPREHENSIVE METABOLIC PANEL
ALT: 66 U/L — ABNORMAL HIGH (ref 0–44)
AST: 36 U/L (ref 15–41)
Albumin: 3.6 g/dL (ref 3.5–5.0)
Alkaline Phosphatase: 88 U/L (ref 38–126)
Anion gap: 11 (ref 5–15)
BUN: 9 mg/dL (ref 6–20)
CO2: 26 mmol/L (ref 22–32)
Calcium: 9 mg/dL (ref 8.9–10.3)
Chloride: 101 mmol/L (ref 98–111)
Creatinine, Ser: 0.71 mg/dL (ref 0.44–1.00)
GFR, Estimated: >60 mL/min (ref >60)
Glucose, Bld: 93 mg/dL (ref 70–99)
Potassium: 3.8 mmol/L (ref 3.5–5.1)
Sodium: 138 mmol/L (ref 135–145)
Total Bilirubin: 0.6 mg/dL (ref 0.3–1.2)
Total Protein: 6.7 g/dL (ref 6.5–8.1)

## 2021-01-07 LAB — MAGNESIUM: Magnesium: 2 mg/dL (ref 1.7–2.4)

## 2021-01-07 NOTE — Progress Notes (Signed)
[facility] PROGRESS NOTE    Progress Note  [name] [name]  [id number] DOB: Jan 15, 1966 DOA: 01/03/2021 PCP: System, Provider Not In     Brief Narrative:   [name] [name] is an 55 y.o. female past medical history of stage IV right breast cancer with metastatic right axillary node and bone metastases on chemotherapy presented with nausea vomiting abdominal pain as well as diarrhea.  Underwent Port-A-Cath placement on 12/29/2018 21st chemo on 12/29/2020 who comes in complaining of nausea vomiting abdominal pain and diarrhea C. difficile negative on admission CT scan of the abdomen pelvis showed mild diffuse colitis with wall thickening negative for perforation or abscess.    Assessment/Plan:   Abdominal pain nausea and vomiting likely due to   C. difficile colitis: C. difficile positive started on oral vancomycin tentatively for 10 days. Oral dose of vancomycin was increased she relates her bowel movements have improved she is having less watery bowel movements but still watery. Advance diet to regular diet.  Continue to monitor.  Metastatic breast cancer: Last chemotherapy 12/29/2020. Port-A-Cath placed on 12/28/2020.  Pancytopenia: As expected last chemotherapy on 12/29/2020. Now improved.  Hypokalemia: Replete orally now improved likely due to GI losses.   Essential hypertension: Continue current regimen.  DVT prophylaxis: lovenox Family Communication:none Status is: Inpatient  Remains inpatient appropriate because:Hemodynamically unstable  Dispo: The patient is from: Home              Anticipated d/c is to: Home              Patient currently is not medically stable to d/c.   Difficult to place patient No   Code Status:     Code Status Orders  (From admission, onward)           Start     Ordered   01/03/21 2118  Full code  Continuous        01/03/21 2119           Code Status History     Date Active Date Inactive Code Status Order ID Comments User  Context   04/20/2013 0213 04/21/2013 1943 Full Code [id number]  Berle Mull, MD Inpatient   02/20/2012 1723 02/22/2012 1723 Full Code [id number]  Lawernce Ion, RN Inpatient   11/12/2011 1538 11/16/2011 2158 Full Code [id number]  Theodis Blaze, MD ED         IV Access:   Peripheral IV   Procedures and diagnostic studies:   No results found.   Medical Consultants:   None.   Subjective:    [name] [name] continues to have at least 6 watery bowel movements a day, she does relate is improved from when she came in on admission that she was having greater than 10 watery bowel movements a day  Objective:    Vitals:   01/05/21 2020 01/06/21 0610 01/06/21 1410 01/06/21 2120  BP: (!) 122/92 (!) 110/59 116/64 (!) 150/73  Pulse: 76 73 75 71  Resp: '18 18 16 18  '$ Temp: 98.3 F (36.8 C) 98.8 F (37.1 C) 98.3 F (36.8 C) 99 F (37.2 C)  TempSrc: Oral Oral Oral Oral  SpO2: 99% 93% 97% 93%  Weight:      Height:       SpO2: 93 %  No intake or output data in the 24 hours ending 01/07/21 0915  Filed Weights   01/03/21 2210  Weight: 104.5 kg    Exam: General exam: In no acute distress. Respiratory system: Good air movement  and clear to auscultation. Cardiovascular system: S1 & S2 heard, RRR. No JVD. Gastrointestinal system: Abdomen is nondistended, soft and nontender.  Extremities: No pedal edema. Skin: No rashes, lesions or ulcers Psychiatry: Judgement and insight appear normal. Mood & affect appropriate.   Data Reviewed:    Labs: Basic Metabolic Panel: Recent Labs  Lab 01/03/21 1428 01/04/21 0515 01/05/21 0513 01/06/21 0539 01/07/21 0528  NA 136 135 138 137 138  K 3.7 3.1* 3.5 3.8 3.8  CL 99 101 99 99 101  CO2 '26 27 28 27 26  '$ GLUCOSE 116* 102* 101* 92 93  BUN 22* '16 8 9 9  '$ CREATININE 0.93 0.82 0.69 0.69 0.71  CALCIUM 9.3 8.2* 9.1 8.9 9.0  MG  --  2.2 1.9 2.0 2.0    GFR Estimated Creatinine Clearance: 98.2 mL/min (by C-G formula based on SCr of  0.71 mg/dL). Liver Function Tests: Recent Labs  Lab 01/03/21 1428 01/05/21 0513 01/06/21 0539 01/07/21 0528  AST 40 36 35 36  ALT 55* 61* 64* 66*  ALKPHOS 89 80 79 88  BILITOT 1.9* 1.3* 0.7 0.6  PROT 7.8 6.5 6.5 6.7  ALBUMIN 4.1 3.5 3.5 3.6    Recent Labs  Lab 01/03/21 1428  LIPASE 27    No results for input(s): AMMONIA in the last 168 hours. Coagulation profile No results for input(s): INR, PROTIME in the last 168 hours. COVID-19 Labs  No results for input(s): DDIMER, FERRITIN, LDH, CRP in the last 72 hours.  Lab Results  Component Value Date   [place] NEGATIVE 01/03/2021    CBC: Recent Labs  Lab 01/03/21 1428 01/04/21 0515 01/05/21 0513 01/06/21 0539 01/07/21 0528  WBC 2.8* 1.8* 2.7* 6.3 9.6  NEUTROABS 1.7  --  1.0* 3.8 7.1  HGB 14.1 11.8* 12.7 12.4 13.2  HCT 42.7 37.6 39.2 38.2 41.6  MCV 81.8 84.3 83.9 84.0 84.9  PLT 145* 106* 102* 116* 120*    Cardiac Enzymes: No results for input(s): CKTOTAL, CKMB, CKMBINDEX, TROPONINI in the last 168 hours. BNP (last 3 results) No results for input(s): PROBNP in the last 8760 hours. CBG: No results for input(s): GLUCAP in the last 168 hours. D-Dimer: No results for input(s): DDIMER in the last 72 hours. Hgb A1c: No results for input(s): HGBA1C in the last 72 hours. Lipid Profile: No results for input(s): CHOL, HDL, LDLCALC, TRIG, CHOLHDL, LDLDIRECT in the last 72 hours. Thyroid function studies: No results for input(s): TSH, T4TOTAL, T3FREE, THYROIDAB in the last 72 hours.  Invalid input(s): FREET3 Anemia work up: No results for input(s): VITAMINB12, FOLATE, FERRITIN, TIBC, IRON, RETICCTPCT in the last 72 hours. Sepsis Labs: Recent Labs  Lab 01/04/21 0515 01/05/21 0513 01/06/21 0539 01/07/21 0528  WBC 1.8* 2.7* 6.3 9.6    Microbiology Recent Results (from the past 240 hour(s))  C Difficile Quick Screen w PCR reflex     Status: Abnormal   Collection Time: 01/03/21  1:58 PM   Specimen: Stool   Result Value Ref Range Status   C Diff antigen POSITIVE (A) NEGATIVE Final   C Diff toxin POSITIVE (A) NEGATIVE Final   C Diff interpretation Toxin producing C. difficile detected.  Final    Comment: CRITICAL RESULT CALLED TO, READ BACK BY AND VERIFIED WITH: HICKS,W RN '@1946'$  ON 01/03/21 JACKSON,K Performed at [facility], [place] [address], [place], [place] [address]   Gastrointestinal Panel by PCR , Stool     Status: None   Collection Time: 01/03/21  6:30 PM  Specimen: Stool  Result Value Ref Range Status   Campylobacter species NOT DETECTED NOT DETECTED Final   Plesimonas shigelloides NOT DETECTED NOT DETECTED Final   Salmonella species NOT DETECTED NOT DETECTED Final   Yersinia enterocolitica NOT DETECTED NOT DETECTED Final   Vibrio species NOT DETECTED NOT DETECTED Final   Vibrio cholerae NOT DETECTED NOT DETECTED Final   Enteroaggregative E coli (EAEC) NOT DETECTED NOT DETECTED Final   Enteropathogenic E coli (EPEC) NOT DETECTED NOT DETECTED Final   Enterotoxigenic E coli (ETEC) NOT DETECTED NOT DETECTED Final   Shiga like toxin producing E coli (STEC) NOT DETECTED NOT DETECTED Final   Shigella/Enteroinvasive E coli (EIEC) NOT DETECTED NOT DETECTED Final   Cryptosporidium NOT DETECTED NOT DETECTED Final   Cyclospora cayetanensis NOT DETECTED NOT DETECTED Final   Entamoeba histolytica NOT DETECTED NOT DETECTED Final   Giardia lamblia NOT DETECTED NOT DETECTED Final   Adenovirus F40/41 NOT DETECTED NOT DETECTED Final   Astrovirus NOT DETECTED NOT DETECTED Final   Norovirus GI/GII NOT DETECTED NOT DETECTED Final   Rotavirus A NOT DETECTED NOT DETECTED Final   Sapovirus (I, II, IV, and V) NOT DETECTED NOT DETECTED Final    Comment: Performed at [facility], [address] [place], [place] [address]  Resp Panel by RT-PCR (Flu A&B, Covid) Nasopharyngeal Swab     Status: None   Collection Time: 01/03/21  7:56 PM   Specimen: Nasopharyngeal Swab;  Nasopharyngeal(NP) swabs in vial transport medium  Result Value Ref Range Status   SARS Coronavirus 2 by RT PCR NEGATIVE NEGATIVE Final    Comment: (NOTE) SARS-CoV-2 target nucleic acids are NOT DETECTED.  The SARS-CoV-2 RNA is generally detectable in upper respiratory specimens during the acute phase of infection. The lowest concentration of SARS-CoV-2 viral copies this assay can detect is 138 copies/mL. A negative result does not preclude SARS-Cov-2 infection and should not be used as the sole basis for treatment or other patient management decisions. A negative result may occur with  improper specimen collection/handling, submission of specimen other than nasopharyngeal swab, presence of viral mutation(s) within the areas targeted by this assay, and inadequate number of viral copies(<138 copies/mL). A negative result must be combined with clinical observations, patient history, and epidemiological information. The expected result is Negative.  Fact Sheet for Patients:  EntrepreneurPulse.com.au  Fact Sheet for Healthcare Providers:  IncredibleEmployment.be  This test is no t yet approved or cleared by the [place] FDA and  has been authorized for detection and/or diagnosis of SARS-CoV-2 by FDA under an Emergency Use Authorization (EUA). This EUA will remain  in effect (meaning this test can be used) for the duration of the COVID-19 declaration under Section 564(b)(1) of the Act, 21 U.S.C.section 360bbb-3(b)(1), unless the authorization is terminated  or revoked sooner.       Influenza A by PCR NEGATIVE NEGATIVE Final   Influenza B by PCR NEGATIVE NEGATIVE Final    Comment: (NOTE) The Xpert Xpress SARS-CoV-2/FLU/RSV plus assay is intended as an aid in the diagnosis of influenza from Nasopharyngeal swab specimens and should not be used as a sole basis for treatment. Nasal washings and aspirates are unacceptable for Xpert Xpress  SARS-CoV-2/FLU/RSV testing.  Fact Sheet for Patients: EntrepreneurPulse.com.au  Fact Sheet for Healthcare Providers: IncredibleEmployment.be  This test is not yet approved or cleared by the [place] FDA and has been authorized for detection and/or diagnosis of SARS-CoV-2 by FDA under an Emergency Use Authorization (EUA). This EUA will remain in  effect (meaning this test can be used) for the duration of the COVID-19 declaration under Section 564(b)(1) of the Act, 21 U.S.C. section 360bbb-3(b)(1), unless the authorization is terminated or revoked.  Performed at [facility], [place] [address] [place], [place] [address]      Medications:    Chlorhexidine Gluconate Cloth  6 each Topical Daily   enoxaparin (LOVENOX) injection  50 mg Subcutaneous Q24H   lisinopril  20 mg Oral Daily   And   hydrochlorothiazide  25 mg Oral Daily   rosuvastatin  10 mg Oral QHS   vancomycin  500 mg Oral QID   Continuous Infusions:  sodium chloride 75 mL/hr at 01/06/21 0649      LOS: 3 days   Charlynne Cousins  [facility]  01/07/2021, 9:15 AM

## 2021-01-08 ENCOUNTER — Other Ambulatory Visit (HOSPITAL_COMMUNITY): Payer: Self-pay

## 2021-01-08 LAB — COMPREHENSIVE METABOLIC PANEL
ALT: 62 U/L — ABNORMAL HIGH (ref 0–44)
AST: 34 U/L (ref 15–41)
Albumin: 3.5 g/dL (ref 3.5–5.0)
Alkaline Phosphatase: 89 U/L (ref 38–126)
Anion gap: 10 (ref 5–15)
BUN: 13 mg/dL (ref 6–20)
CO2: 24 mmol/L (ref 22–32)
Calcium: 8.9 mg/dL (ref 8.9–10.3)
Chloride: 102 mmol/L (ref 98–111)
Creatinine, Ser: 0.78 mg/dL (ref 0.44–1.00)
GFR, Estimated: >60 mL/min (ref >60)
Glucose, Bld: 100 mg/dL — ABNORMAL HIGH (ref 70–99)
Potassium: 3.5 mmol/L (ref 3.5–5.1)
Sodium: 136 mmol/L (ref 135–145)
Total Bilirubin: 0.5 mg/dL (ref 0.3–1.2)
Total Protein: 6.5 g/dL (ref 6.5–8.1)

## 2021-01-08 LAB — CBC WITH DIFFERENTIAL/PLATELET
Abs Immature Granulocytes: 0.16 10*3/uL — ABNORMAL HIGH (ref 0.00–0.07)
Basophils Absolute: 0 10*3/uL (ref 0.0–0.1)
Basophils Relative: 0 %
Eosinophils Absolute: 0.1 10*3/uL (ref 0.0–0.5)
Eosinophils Relative: 1 %
HCT: 39.4 % (ref 36.0–46.0)
Hemoglobin: 12.6 g/dL (ref 12.0–15.0)
Immature Granulocytes: 2 %
Lymphocytes Relative: 10 %
Lymphs Abs: 1 10*3/uL (ref 0.7–4.0)
MCH: 27.1 pg (ref 26.0–34.0)
MCHC: 32 g/dL (ref 30.0–36.0)
MCV: 84.7 fL (ref 80.0–100.0)
Monocytes Absolute: 1.2 10*3/uL — ABNORMAL HIGH (ref 0.1–1.0)
Monocytes Relative: 12 %
Neutro Abs: 7.2 10*3/uL (ref 1.7–7.7)
Neutrophils Relative %: 75 %
Platelets: 121 10*3/uL — ABNORMAL LOW (ref 150–400)
RBC: 4.65 MIL/uL (ref 3.87–5.11)
RDW: 13.4 % (ref 11.5–15.5)
WBC: 9.6 10*3/uL (ref 4.0–10.5)
nRBC: 0 % (ref 0.0–0.2)

## 2021-01-08 LAB — MAGNESIUM: Magnesium: 2 mg/dL (ref 1.7–2.4)

## 2021-01-08 MED ORDER — BACITRACIN-NEOMYCIN-POLYMYXIN OINTMENT TUBE
TOPICAL_OINTMENT | Freq: Two times a day (BID) | CUTANEOUS | Status: DC
  Filled 2021-01-08: qty 14.17

## 2021-01-08 MED ORDER — FIDAXOMICIN 200 MG PO TABS: 200.0000 mg | ORAL_TABLET | Freq: Two times a day (BID) | ORAL | Status: DC

## 2021-01-08 MED ORDER — FIDAXOMICIN 200 MG PO TABS
200.0000 mg | ORAL_TABLET | Freq: Two times a day (BID) | ORAL | Status: DC
  Administered 2021-01-08 – 2021-01-09 (×3): 200 mg via ORAL
  Filled 2021-01-08 (×3): qty 1

## 2021-01-08 MED ORDER — POTASSIUM CHLORIDE CRYS ER 20 MEQ PO TBCR
40.0000 meq | EXTENDED_RELEASE_TABLET | Freq: Two times a day (BID) | ORAL | Status: AC
  Administered 2021-01-08 (×2): 40 meq via ORAL
  Filled 2021-01-08 (×2): qty 2

## 2021-01-08 NOTE — Progress Notes (Signed)
[facility] PROGRESS NOTE    Progress Note  [name] [name]  [id number] DOB: September 12, 1965 DOA: 01/03/2021 PCP: System, Provider Not In     Brief Narrative:   [name] [name] is an 55 y.o. female past medical history of stage IV right breast cancer with metastatic right axillary node and bone metastases on chemotherapy presented with nausea vomiting abdominal pain as well as diarrhea.  Underwent Port-A-Cath placement on 12/29/2018 21st chemo on 12/29/2020 who comes in complaining of nausea vomiting abdominal pain and diarrhea C. difficile negative on admission CT scan of the abdomen pelvis showed mild diffuse colitis with wall thickening negative for perforation or abscess.    Assessment/Plan:   Abdominal pain nausea and vomiting likely due to   C. difficile colitis: C. difficile positive started on oral vancomycin tentatively for 10 days. Continues to have more than 10 watery bowel movements.  We will go ahead and discontinue bank start oral Dificid.  Metastatic breast cancer: Last chemotherapy 12/29/2020. Port-A-Cath placed on 12/28/2020.  Pancytopenia: As expected last chemotherapy on 12/29/2020. Now improved.  Hypokalemia: Replete orally now improved likely due to GI losses.   Essential hypertension: Continue current regimen.  DVT prophylaxis: lovenox Family Communication:none Status is: Inpatient  Remains inpatient appropriate because:Hemodynamically unstable  Dispo: The patient is from: Home              Anticipated d/c is to: Home              Patient currently is not medically stable to d/c.   Difficult to place patient No   Code Status:     Code Status Orders  (From admission, onward)           Start     Ordered   01/03/21 2118  Full code  Continuous        01/03/21 2119           Code Status History     Date Active Date Inactive Code Status Order ID Comments User Context   04/20/2013 0213 04/21/2013 1943 Full Code [id number]  Berle Mull, MD  Inpatient   02/20/2012 1723 02/22/2012 1723 Full Code [id number]  Lawernce Ion, RN Inpatient   11/12/2011 1538 11/16/2011 2158 Full Code [id number]  Theodis Blaze, MD ED         IV Access:   Peripheral IV   Procedures and diagnostic studies:   No results found.   Medical Consultants:   None.   Subjective:    [name] [name] she had more than 10 watery bowel movements in the last 24 hours.  Objective:    Vitals:   01/07/21 1007 01/07/21 1430 01/07/21 2054 01/08/21 0552  BP: 121/83 (!) 105/59 120/76 110/79  Pulse:  73 82 86  Resp:  '16 16 16  '$ Temp:  98.5 F (36.9 C) 98.8 F (37.1 C) 98.8 F (37.1 C)  TempSrc:  Oral Oral Oral  SpO2:  97% 96% 93%  Weight:      Height:       SpO2: 93 %   Intake/Output Summary (Last 24 hours) at 01/08/2021 1002 Last data filed at 01/08/2021 [id number] Gross per 24 hour  Intake 1020 ml  Output --  Net 1020 ml    Filed Weights   01/03/21 2210  Weight: 104.5 kg    Exam: General exam: In no acute distress. Respiratory system: Good air movement and clear to auscultation. Cardiovascular system: S1 & S2 heard, RRR. No JVD. Gastrointestinal system: Abdomen is nondistended, soft  and nontender.  Extremities: No pedal edema. Skin: No rashes, lesions or ulcers  Data Reviewed:    Labs: Basic Metabolic Panel: Recent Labs  Lab 01/04/21 0515 01/05/21 0513 01/06/21 0539 01/07/21 0528 01/08/21 0544  NA 135 138 137 138 136  K 3.1* 3.5 3.8 3.8 3.5  CL 101 99 99 101 102  CO2 '27 28 27 26 24  '$ GLUCOSE 102* 101* 92 93 100*  BUN '16 8 9 9 13  '$ CREATININE 0.82 0.69 0.69 0.71 0.78  CALCIUM 8.2* 9.1 8.9 9.0 8.9  MG 2.2 1.9 2.0 2.0 2.0    GFR Estimated Creatinine Clearance: 98.2 mL/min (by C-G formula based on SCr of 0.78 mg/dL). Liver Function Tests: Recent Labs  Lab 01/03/21 1428 01/05/21 0513 01/06/21 0539 01/07/21 0528 01/08/21 0544  AST 40 36 35 36 34  ALT 55* 61* 64* 66* 62*  ALKPHOS 89 80 79 88 89  BILITOT 1.9* 1.3*  0.7 0.6 0.5  PROT 7.8 6.5 6.5 6.7 6.5  ALBUMIN 4.1 3.5 3.5 3.6 3.5    Recent Labs  Lab 01/03/21 1428  LIPASE 27    No results for input(s): AMMONIA in the last 168 hours. Coagulation profile No results for input(s): INR, PROTIME in the last 168 hours. COVID-19 Labs  No results for input(s): DDIMER, FERRITIN, LDH, CRP in the last 72 hours.  Lab Results  Component Value Date   [place] NEGATIVE 01/03/2021    CBC: Recent Labs  Lab 01/03/21 1428 01/04/21 0515 01/05/21 0513 01/06/21 0539 01/07/21 0528 01/08/21 0544  WBC 2.8* 1.8* 2.7* 6.3 9.6 9.6  NEUTROABS 1.7  --  1.0* 3.8 7.1 7.2  HGB 14.1 11.8* 12.7 12.4 13.2 12.6  HCT 42.7 37.6 39.2 38.2 41.6 39.4  MCV 81.8 84.3 83.9 84.0 84.9 84.7  PLT 145* 106* 102* 116* 120* 121*    Cardiac Enzymes: No results for input(s): CKTOTAL, CKMB, CKMBINDEX, TROPONINI in the last 168 hours. BNP (last 3 results) No results for input(s): PROBNP in the last 8760 hours. CBG: No results for input(s): GLUCAP in the last 168 hours. D-Dimer: No results for input(s): DDIMER in the last 72 hours. Hgb A1c: No results for input(s): HGBA1C in the last 72 hours. Lipid Profile: No results for input(s): CHOL, HDL, LDLCALC, TRIG, CHOLHDL, LDLDIRECT in the last 72 hours. Thyroid function studies: No results for input(s): TSH, T4TOTAL, T3FREE, THYROIDAB in the last 72 hours.  Invalid input(s): FREET3 Anemia work up: No results for input(s): VITAMINB12, FOLATE, FERRITIN, TIBC, IRON, RETICCTPCT in the last 72 hours. Sepsis Labs: Recent Labs  Lab 01/05/21 0513 01/06/21 0539 01/07/21 0528 01/08/21 0544  WBC 2.7* 6.3 9.6 9.6    Microbiology Recent Results (from the past 240 hour(s))  C Difficile Quick Screen w PCR reflex     Status: Abnormal   Collection Time: 01/03/21  1:58 PM   Specimen: Stool  Result Value Ref Range Status   C Diff antigen POSITIVE (A) NEGATIVE Final   C Diff toxin POSITIVE (A) NEGATIVE Final   C Diff  interpretation Toxin producing C. difficile detected.  Final    Comment: CRITICAL RESULT CALLED TO, READ BACK BY AND VERIFIED WITH: HICKS,W RN '@1946'$  ON 01/03/21 JACKSON,K Performed at [facility], [place] [address], [place], [place] [address]   Gastrointestinal Panel by PCR , Stool     Status: None   Collection Time: 01/03/21  6:30 PM   Specimen: Stool  Result Value Ref Range Status   Campylobacter species NOT DETECTED  NOT DETECTED Final   Plesimonas shigelloides NOT DETECTED NOT DETECTED Final   Salmonella species NOT DETECTED NOT DETECTED Final   Yersinia enterocolitica NOT DETECTED NOT DETECTED Final   Vibrio species NOT DETECTED NOT DETECTED Final   Vibrio cholerae NOT DETECTED NOT DETECTED Final   Enteroaggregative E coli (EAEC) NOT DETECTED NOT DETECTED Final   Enteropathogenic E coli (EPEC) NOT DETECTED NOT DETECTED Final   Enterotoxigenic E coli (ETEC) NOT DETECTED NOT DETECTED Final   Shiga like toxin producing E coli (STEC) NOT DETECTED NOT DETECTED Final   Shigella/Enteroinvasive E coli (EIEC) NOT DETECTED NOT DETECTED Final   Cryptosporidium NOT DETECTED NOT DETECTED Final   Cyclospora cayetanensis NOT DETECTED NOT DETECTED Final   Entamoeba histolytica NOT DETECTED NOT DETECTED Final   Giardia lamblia NOT DETECTED NOT DETECTED Final   Adenovirus F40/41 NOT DETECTED NOT DETECTED Final   Astrovirus NOT DETECTED NOT DETECTED Final   Norovirus GI/GII NOT DETECTED NOT DETECTED Final   Rotavirus A NOT DETECTED NOT DETECTED Final   Sapovirus (I, II, IV, and V) NOT DETECTED NOT DETECTED Final    Comment: Performed at [facility], [place] [place], [place] [address]  Resp Panel by RT-PCR (Flu A&B, Covid) Nasopharyngeal Swab     Status: None   Collection Time: 01/03/21  7:56 PM   Specimen: Nasopharyngeal Swab; Nasopharyngeal(NP) swabs in vial transport medium  Result Value Ref Range Status   SARS Coronavirus 2 by RT PCR NEGATIVE NEGATIVE Final     Comment: (NOTE) SARS-CoV-2 target nucleic acids are NOT DETECTED.  The SARS-CoV-2 RNA is generally detectable in upper respiratory specimens during the acute phase of infection. The lowest concentration of SARS-CoV-2 viral copies this assay can detect is 138 copies/mL. A negative result does not preclude SARS-Cov-2 infection and should not be used as the sole basis for treatment or other patient management decisions. A negative result may occur with  improper specimen collection/handling, submission of specimen other than nasopharyngeal swab, presence of viral mutation(s) within the areas targeted by this assay, and inadequate number of viral copies(<138 copies/mL). A negative result must be combined with clinical observations, patient history, and epidemiological information. The expected result is Negative.  Fact Sheet for Patients:  EntrepreneurPulse.com.au  Fact Sheet for Healthcare Providers:  IncredibleEmployment.be  This test is no t yet approved or cleared by the [place] FDA and  has been authorized for detection and/or diagnosis of SARS-CoV-2 by FDA under an Emergency Use Authorization (EUA). This EUA will remain  in effect (meaning this test can be used) for the duration of the COVID-19 declaration under Section 564(b)(1) of the Act, 21 U.S.C.section 360bbb-3(b)(1), unless the authorization is terminated  or revoked sooner.       Influenza A by PCR NEGATIVE NEGATIVE Final   Influenza B by PCR NEGATIVE NEGATIVE Final    Comment: (NOTE) The Xpert Xpress SARS-CoV-2/FLU/RSV plus assay is intended as an aid in the diagnosis of influenza from Nasopharyngeal swab specimens and should not be used as a sole basis for treatment. Nasal washings and aspirates are unacceptable for Xpert Xpress SARS-CoV-2/FLU/RSV testing.  Fact Sheet for Patients: EntrepreneurPulse.com.au  Fact Sheet for Healthcare  Providers: IncredibleEmployment.be  This test is not yet approved or cleared by the [place] FDA and has been authorized for detection and/or diagnosis of SARS-CoV-2 by FDA under an Emergency Use Authorization (EUA). This EUA will remain in effect (meaning this test can be used) for the duration of the COVID-19 declaration  under Section 564(b)(1) of the Act, 21 U.S.C. section 360bbb-3(b)(1), unless the authorization is terminated or revoked.  Performed at [facility], [place] [address], [place], [place] [address]      Medications:    Chlorhexidine Gluconate Cloth  6 each Topical Daily   enoxaparin (LOVENOX) injection  50 mg Subcutaneous Q24H   fidaxomicin  200 mg Oral BID   lisinopril  20 mg Oral Daily   And   hydrochlorothiazide  25 mg Oral Daily   rosuvastatin  10 mg Oral QHS   Continuous Infusions:  sodium chloride 75 mL/hr at 01/08/21 0642      LOS: 4 days   Charlynne Cousins  [facility]  01/08/2021, 10:02 AM

## 2021-01-09 LAB — CBC WITH DIFFERENTIAL/PLATELET
Abs Immature Granulocytes: 0.15 10*3/uL — ABNORMAL HIGH (ref 0.00–0.07)
Basophils Absolute: 0 10*3/uL (ref 0.0–0.1)
Basophils Relative: 0 %
Eosinophils Absolute: 0 10*3/uL (ref 0.0–0.5)
Eosinophils Relative: 1 %
HCT: 39.6 % (ref 36.0–46.0)
Hemoglobin: 12.4 g/dL (ref 12.0–15.0)
Immature Granulocytes: 2 %
Lymphocytes Relative: 11 %
Lymphs Abs: 1 10*3/uL (ref 0.7–4.0)
MCH: 26.7 pg (ref 26.0–34.0)
MCHC: 31.3 g/dL (ref 30.0–36.0)
MCV: 85.3 fL (ref 80.0–100.0)
Monocytes Absolute: 1 10*3/uL (ref 0.1–1.0)
Monocytes Relative: 12 %
Neutro Abs: 6.2 10*3/uL (ref 1.7–7.7)
Neutrophils Relative %: 74 %
Platelets: 102 10*3/uL — ABNORMAL LOW (ref 150–400)
RBC: 4.64 MIL/uL (ref 3.87–5.11)
RDW: 13.4 % (ref 11.5–15.5)
WBC: 8.3 10*3/uL (ref 4.0–10.5)
nRBC: 0 % (ref 0.0–0.2)

## 2021-01-09 LAB — COMPREHENSIVE METABOLIC PANEL
ALT: 61 U/L — ABNORMAL HIGH (ref 0–44)
AST: 36 U/L (ref 15–41)
Albumin: 3.5 g/dL (ref 3.5–5.0)
Alkaline Phosphatase: 85 U/L (ref 38–126)
Anion gap: 8 (ref 5–15)
BUN: 9 mg/dL (ref 6–20)
CO2: 23 mmol/L (ref 22–32)
Calcium: 8.5 mg/dL — ABNORMAL LOW (ref 8.9–10.3)
Chloride: 104 mmol/L (ref 98–111)
Creatinine, Ser: 0.77 mg/dL (ref 0.44–1.00)
GFR, Estimated: >60 mL/min (ref >60)
Glucose, Bld: 93 mg/dL (ref 70–99)
Potassium: 3.6 mmol/L (ref 3.5–5.1)
Sodium: 135 mmol/L (ref 135–145)
Total Bilirubin: 0.4 mg/dL (ref 0.3–1.2)
Total Protein: 6.4 g/dL — ABNORMAL LOW (ref 6.5–8.1)

## 2021-01-09 LAB — MAGNESIUM: Magnesium: 2 mg/dL (ref 1.7–2.4)

## 2021-01-09 MED ORDER — BACITRACIN-NEOMYCIN-POLYMYXIN OINTMENT TUBE: 1.0000 "application " | TOPICAL_OINTMENT | Freq: Two times a day (BID) | CUTANEOUS | 0 refills | Status: AC

## 2021-01-09 MED ORDER — FIDAXOMICIN 200 MG PO TABS: 200.0000 mg | ORAL_TABLET | Freq: Two times a day (BID) | ORAL | 0 refills | Status: AC

## 2021-01-09 NOTE — Progress Notes (Deleted)
Physician Discharge Summary  [name] [name] [id number] DOB: Jan 06, 1966 DOA: 01/03/2021  PCP: System, Provider Not In  Admit date: 01/03/2021 Discharge date: 01/09/2021  Admitted From: Home Disposition:  Home  Recommendations for Outpatient Follow-up:  Follow up with PCP in 1-2 weeks Please obtain BMP/CBC in one week   Home Health:no Equipment/Devices:none  Discharge Condition:Stable CODE STATUS:Full Diet recommendation: Heart Healthy  Brief/Interim Summary: 55 y.o. female past medical history of stage IV right breast cancer with metastatic right axillary node and bone metastases on chemotherapy presented with nausea vomiting abdominal pain as well as diarrhea.  Underwent Port-A-Cath placement on 12/29/2018 21st chemo on 12/29/2020 who comes in complaining of nausea vomiting abdominal pain and diarrhea C. difficile negative on admission CT scan of the abdomen pelvis showed mild diffuse colitis with wall thickening negative for perforation or abscess.  Discharge Diagnoses:  Principal Problem:   C. difficile colitis Active Problems:   Hypertension   Hyperlipidemia   Metastatic breast cancer ([place])   Pancytopenia ([facility])   Hypokalemia  C. difficile colitis: C. difficile positive on admission was started on oral Vanco with no improvement. She was changed to oral Dificid and her bowel movements improved and they were more formed so she will continue Dificid as an outpatient for 9 more days.  Metastatic breast cancer: Follow-up with oncology as an outpatient.  Pancytopenia: I suspect that last chemotherapy was 12/29/2020, improved.  Hypokalemia: Replete orally now resolved likely due to GI losses.  Essential hypertension: Continue current regimen no changes made.  Discharge Instructions  Discharge Instructions     Diet - low sodium heart healthy   Complete by: As directed    Increase activity slowly   Complete by: As directed       Allergies as of 01/09/2021        Reactions   Amlodipine Swelling, Other (See Comments)   Only the feet became swollen   Morphine And Related Hives, Nausea And Vomiting, Nausea Only, Other (See Comments)   Headaches and sweats, also   Codeine Hives, Nausea Only, Other (See Comments)   Headaches, also        Medication List     TAKE these medications    aspirin-acetaminophen-caffeine 250-250-65 MG tablet Commonly known as: EXCEDRIN MIGRAINE Take 2 tablets by mouth every 6 (six) hours as needed for headache.   ferrous sulfate 325 (65 FE) MG tablet Take 325 mg by mouth 2 (two) times a week.   fidaxomicin 200 MG Tabs tablet Commonly known as: DIFICID Take 1 tablet (200 mg total) by mouth 2 (two) times daily for 9 days.   lisinopril-hydrochlorothiazide 20-25 MG tablet Commonly known as: ZESTORETIC Take 1 tablet by mouth daily.   naproxen sodium 220 MG tablet Commonly known as: ALEVE Take 220-440 mg by mouth 2 (two) times daily as needed (for headaches).   neomycin-bacitracin-polymyxin Oint Commonly known as: NEOSPORIN Apply 1 application topically 2 (two) times daily for 5 days.   POTASSIUM PO Take 1 tablet by mouth every other day.   rosuvastatin 10 MG tablet Commonly known as: CRESTOR Take 10 mg by mouth at bedtime.   VITAMIN B12 PO Take 1 tablet by mouth daily.   VITAMIN D3 PO Take 1 capsule by mouth daily.        Allergies  Allergen Reactions   Amlodipine Swelling and Other (See Comments)    Only the feet became swollen   Morphine And Related Hives, Nausea And Vomiting, Nausea Only and Other (See Comments)  Headaches and sweats, also    Codeine Hives, Nausea Only and Other (See Comments)    Headaches, also    Consultations: None   Procedures/Studies: CT ABDOMEN PELVIS W CONTRAST  Result Date: 01/03/2021 CLINICAL DATA:  Diarrhea abdominal pain EXAM: CT ABDOMEN AND PELVIS WITH CONTRAST TECHNIQUE: Multidetector CT imaging of the abdomen and pelvis was performed using the standard  protocol following bolus administration of intravenous contrast. CONTRAST:  63m OMNIPAQUE IOHEXOL 350 MG/ML SOLN COMPARISON:  CT 12/21/2020 FINDINGS: Lower chest: Lung bases demonstrate no acute consolidation or effusion. Cardiac size upper limits of normal. Hepatobiliary: No focal liver abnormality is seen. Status post cholecystectomy. No biliary dilatation. Pancreas: Unremarkable. No pancreatic ductal dilatation or surrounding inflammatory changes. Spleen: Normal in size without focal abnormality. Adrenals/Urinary Tract: Adrenal glands are unremarkable. Kidneys are normal, without renal calculi, focal lesion, or hydronephrosis. Bladder is unremarkable. Stomach/Bowel: The stomach is nonenlarged. No dilated small bowel. Mild diffuse colon wall thickening with mucosal enhancement consistent with colitis. Negative appendix. Vascular/Lymphatic: Nonaneurysmal aorta. Similar right cardio phrenic fat pad lymph node. No increasing adenopathy. Reproductive: Status post hysterectomy. No adnexal masses. Other: Negative for free air or free fluid. Small fat containing umbilical hernia Musculoskeletal: No acute osseous abnormality IMPRESSION: 1. Interim development of mild diffuse colon wall thickening with mucosal enhancement consistent with colitis of infectious or inflammatory etiology, to include C difficile. Negative for perforation or abscess. Electronically Signed   By: KDonavan FoilM.D.   On: 01/03/2021 19:25   (Echo, Carotid, EGD, Colonoscopy, ERCP)    Subjective: No complaints  Discharge Exam: Vitals:   01/08/21 2056 01/09/21 0525  BP: 110/72 115/72  Pulse: 74 72  Resp: 14 14  Temp: 98.6 F (37 C) 98.9 F (37.2 C)  SpO2: 96% 93%   Vitals:   01/08/21 1017 01/08/21 1347 01/08/21 2056 01/09/21 0525  BP: 126/76 110/85 110/72 115/72  Pulse: 64 95 74 72  Resp: '18 16 14 14  '$ Temp: 98.1 F (36.7 C) 97.8 F (36.6 C) 98.6 F (37 C) 98.9 F (37.2 C)  TempSrc: Oral Oral Oral Oral  SpO2: 95% 98% 96%  93%  Weight:      Height:        General: Pt is alert, awake, not in acute distress Cardiovascular: RRR, S1/S2 +, no rubs, no gallops Respiratory: CTA bilaterally, no wheezing, no rhonchi Abdominal: Soft, NT, ND, bowel sounds + Extremities: no edema, no cyanosis    The results of significant diagnostics from this hospitalization (including imaging, microbiology, ancillary and laboratory) are listed below for reference.     Microbiology: Recent Results (from the past 240 hour(s))  C Difficile Quick Screen w PCR reflex     Status: Abnormal   Collection Time: 01/03/21  1:58 PM   Specimen: Stool  Result Value Ref Range Status   C Diff antigen POSITIVE (A) NEGATIVE Final   C Diff toxin POSITIVE (A) NEGATIVE Final   C Diff interpretation Toxin producing C. difficile detected.  Final    Comment: CRITICAL RESULT CALLED TO, READ BACK BY AND VERIFIED WITH: HICKS,W RN '@1946'$  ON 01/03/21 JACKSON,K Performed at [facility] [id number] [place] [address]  Gastrointestinal Panel by PCR , Stool     Status: None   Collection Time: 01/03/21  6:30 PM   Specimen: Stool  Result Value Ref Range Status   Campylobacter species NOT DETECTED NOT DETECTED Final   Plesimonas shigelloides NOT DETECTED NOT DETECTED Final   Salmonella species  NOT DETECTED NOT DETECTED Final   Yersinia enterocolitica NOT DETECTED NOT DETECTED Final   Vibrio species NOT DETECTED NOT DETECTED Final   Vibrio cholerae NOT DETECTED NOT DETECTED Final   Enteroaggregative E coli (EAEC) NOT DETECTED NOT DETECTED Final   Enteropathogenic E coli (EPEC) NOT DETECTED NOT DETECTED Final   Enterotoxigenic E coli (ETEC) NOT DETECTED NOT DETECTED Final   Shiga like toxin producing E coli (STEC) NOT DETECTED NOT DETECTED Final   Shigella/Enteroinvasive E coli (EIEC) NOT DETECTED NOT DETECTED Final   Cryptosporidium NOT DETECTED NOT DETECTED Final   Cyclospora cayetanensis NOT DETECTED NOT DETECTED Final    Entamoeba histolytica NOT DETECTED NOT DETECTED Final   Giardia lamblia NOT DETECTED NOT DETECTED Final   Adenovirus F40/41 NOT DETECTED NOT DETECTED Final   Astrovirus NOT DETECTED NOT DETECTED Final   Norovirus GI/GII NOT DETECTED NOT DETECTED Final   Rotavirus A NOT DETECTED NOT DETECTED Final   Sapovirus (I, II, IV, and V) NOT DETECTED NOT DETECTED Final    Comment: Performed at [facility], [address] [place], [place] [address]  Resp Panel by RT-PCR (Flu A&B, Covid) Nasopharyngeal Swab     Status: None   Collection Time: 01/03/21  7:56 PM   Specimen: Nasopharyngeal Swab; Nasopharyngeal(NP) swabs in vial transport medium  Result Value Ref Range Status   SARS Coronavirus 2 by RT PCR NEGATIVE NEGATIVE Final    Comment: (NOTE) SARS-CoV-2 target nucleic acids are NOT DETECTED.  The SARS-CoV-2 RNA is generally detectable in upper respiratory specimens during the acute phase of infection. The lowest concentration of SARS-CoV-2 viral copies this assay can detect is 138 copies/mL. A negative result does not preclude SARS-Cov-2 infection and should not be used as the sole basis for treatment or other patient management decisions. A negative result may occur with  improper specimen collection/handling, submission of specimen other than nasopharyngeal swab, presence of viral mutation(s) within the areas targeted by this assay, and inadequate number of viral copies(<138 copies/mL). A negative result must be combined with clinical observations, patient history, and epidemiological information. The expected result is Negative.  Fact Sheet for Patients:  EntrepreneurPulse.com.au  Fact Sheet for Healthcare Providers:  IncredibleEmployment.be  This test is no t yet approved or cleared by the [place] FDA and  has been authorized for detection and/or diagnosis of SARS-CoV-2 by FDA under an Emergency Use Authorization (EUA). This EUA will  remain  in effect (meaning this test can be used) for the duration of the COVID-19 declaration under Section 564(b)(1) of the Act, 21 U.S.C.section 360bbb-3(b)(1), unless the authorization is terminated  or revoked sooner.       Influenza A by PCR NEGATIVE NEGATIVE Final   Influenza B by PCR NEGATIVE NEGATIVE Final    Comment: (NOTE) The Xpert Xpress SARS-CoV-2/FLU/RSV plus assay is intended as an aid in the diagnosis of influenza from Nasopharyngeal swab specimens and should not be used as a sole basis for treatment. Nasal washings and aspirates are unacceptable for Xpert Xpress SARS-CoV-2/FLU/RSV testing.  Fact Sheet for Patients: EntrepreneurPulse.com.au  Fact Sheet for Healthcare Providers: IncredibleEmployment.be  This test is not yet approved or cleared by the [place] FDA and has been authorized for detection and/or diagnosis of SARS-CoV-2 by FDA under an Emergency Use Authorization (EUA). This EUA will remain in effect (meaning this test can be used) for the duration of the COVID-19 declaration under Section 564(b)(1) of the Act, 21 U.S.C. section 360bbb-3(b)(1), unless the authorization is terminated or  revoked.  Performed at [facility], [place] [address], [place], [place] [address]      Labs: BNP (last 3 results) No results for input(s): BNP in the last 8760 hours. Basic Metabolic Panel: Recent Labs  Lab 01/05/21 0513 01/06/21 0539 01/07/21 0528 01/08/21 0544 01/09/21 0759  NA 138 137 138 136 135  K 3.5 3.8 3.8 3.5 3.6  CL 99 99 101 102 104  CO2 '28 27 26 24 23  '$ GLUCOSE 101* 92 93 100* 93  BUN '8 9 9 13 9  '$ CREATININE 0.69 0.69 0.71 0.78 0.77  CALCIUM 9.1 8.9 9.0 8.9 8.5*  MG 1.9 2.0 2.0 2.0 2.0   Liver Function Tests: Recent Labs  Lab 01/05/21 0513 01/06/21 0539 01/07/21 0528 01/08/21 0544 01/09/21 0759  AST 36 35 36 34 36  ALT 61* 64* 66* 62* 61*  ALKPHOS 80 79 88 89 85  BILITOT 1.3*  0.7 0.6 0.5 0.4  PROT 6.5 6.5 6.7 6.5 6.4*  ALBUMIN 3.5 3.5 3.6 3.5 3.5   Recent Labs  Lab 01/03/21 1428  LIPASE 27   No results for input(s): AMMONIA in the last 168 hours. CBC: Recent Labs  Lab 01/05/21 0513 01/06/21 0539 01/07/21 0528 01/08/21 0544 01/09/21 0759  WBC 2.7* 6.3 9.6 9.6 8.3  NEUTROABS 1.0* 3.8 7.1 7.2 6.2  HGB 12.7 12.4 13.2 12.6 12.4  HCT 39.2 38.2 41.6 39.4 39.6  MCV 83.9 84.0 84.9 84.7 85.3  PLT 102* 116* 120* 121* 102*   Cardiac Enzymes: No results for input(s): CKTOTAL, CKMB, CKMBINDEX, TROPONINI in the last 168 hours. BNP: Invalid input(s): POCBNP CBG: No results for input(s): GLUCAP in the last 168 hours. D-Dimer No results for input(s): DDIMER in the last 72 hours. Hgb A1c No results for input(s): HGBA1C in the last 72 hours. Lipid Profile No results for input(s): CHOL, HDL, LDLCALC, TRIG, CHOLHDL, LDLDIRECT in the last 72 hours. Thyroid function studies No results for input(s): TSH, T4TOTAL, T3FREE, THYROIDAB in the last 72 hours.  Invalid input(s): FREET3 Anemia work up No results for input(s): VITAMINB12, FOLATE, FERRITIN, TIBC, IRON, RETICCTPCT in the last 72 hours. Urinalysis    Component Value Date/Time   COLORURINE YELLOW 01/03/2021 1720   APPEARANCEUR HAZY (A) 01/03/2021 1720   LABSPEC 1.021 01/03/2021 1720   PHURINE 6.0 01/03/2021 1720   GLUCOSEU 50 (A) 01/03/2021 1720   HGBUR NEGATIVE 01/03/2021 1720   BILIRUBINUR NEGATIVE 01/03/2021 1720   KETONESUR 5 (A) 01/03/2021 1720   PROTEINUR 30 (A) 01/03/2021 1720   UROBILINOGEN 1.0 04/19/2013 2233   NITRITE NEGATIVE 01/03/2021 1720   LEUKOCYTESUR NEGATIVE 01/03/2021 1720   Sepsis Labs Invalid input(s): PROCALCITONIN,  WBC,  LACTICIDVEN Microbiology Recent Results (from the past 240 hour(s))  C Difficile Quick Screen w PCR reflex     Status: Abnormal   Collection Time: 01/03/21  1:58 PM   Specimen: Stool  Result Value Ref Range Status   C Diff antigen POSITIVE (A) NEGATIVE  Final   C Diff toxin POSITIVE (A) NEGATIVE Final   C Diff interpretation Toxin producing C. difficile detected.  Final    Comment: CRITICAL RESULT CALLED TO, READ BACK BY AND VERIFIED WITH: HICKS,W RN '@1946'$  ON 01/03/21 JACKSON,K Performed at [facility], [place] [address] [place], [place] [address]   Gastrointestinal Panel by PCR , Stool     Status: None   Collection Time: 01/03/21  6:30 PM   Specimen: Stool  Result Value Ref Range Status   Campylobacter  species NOT DETECTED NOT DETECTED Final   Plesimonas shigelloides NOT DETECTED NOT DETECTED Final   Salmonella species NOT DETECTED NOT DETECTED Final   Yersinia enterocolitica NOT DETECTED NOT DETECTED Final   Vibrio species NOT DETECTED NOT DETECTED Final   Vibrio cholerae NOT DETECTED NOT DETECTED Final   Enteroaggregative E coli (EAEC) NOT DETECTED NOT DETECTED Final   Enteropathogenic E coli (EPEC) NOT DETECTED NOT DETECTED Final   Enterotoxigenic E coli (ETEC) NOT DETECTED NOT DETECTED Final   Shiga like toxin producing E coli (STEC) NOT DETECTED NOT DETECTED Final   Shigella/Enteroinvasive E coli (EIEC) NOT DETECTED NOT DETECTED Final   Cryptosporidium NOT DETECTED NOT DETECTED Final   Cyclospora cayetanensis NOT DETECTED NOT DETECTED Final   Entamoeba histolytica NOT DETECTED NOT DETECTED Final   Giardia lamblia NOT DETECTED NOT DETECTED Final   Adenovirus F40/41 NOT DETECTED NOT DETECTED Final   Astrovirus NOT DETECTED NOT DETECTED Final   Norovirus GI/GII NOT DETECTED NOT DETECTED Final   Rotavirus A NOT DETECTED NOT DETECTED Final   Sapovirus (I, II, IV, and V) NOT DETECTED NOT DETECTED Final    Comment: Performed at [facility], [address] [place], [place] [address]  Resp Panel by RT-PCR (Flu A&B, Covid) Nasopharyngeal Swab     Status: None   Collection Time: 01/03/21  7:56 PM   Specimen: Nasopharyngeal Swab; Nasopharyngeal(NP) swabs in vial transport medium  Result Value Ref Range Status    SARS Coronavirus 2 by RT PCR NEGATIVE NEGATIVE Final    Comment: (NOTE) SARS-CoV-2 target nucleic acids are NOT DETECTED.  The SARS-CoV-2 RNA is generally detectable in upper respiratory specimens during the acute phase of infection. The lowest concentration of SARS-CoV-2 viral copies this assay can detect is 138 copies/mL. A negative result does not preclude SARS-Cov-2 infection and should not be used as the sole basis for treatment or other patient management decisions. A negative result may occur with  improper specimen collection/handling, submission of specimen other than nasopharyngeal swab, presence of viral mutation(s) within the areas targeted by this assay, and inadequate number of viral copies(<138 copies/mL). A negative result must be combined with clinical observations, patient history, and epidemiological information. The expected result is Negative.  Fact Sheet for Patients:  EntrepreneurPulse.com.au  Fact Sheet for Healthcare Providers:  IncredibleEmployment.be  This test is no t yet approved or cleared by the [place] FDA and  has been authorized for detection and/or diagnosis of SARS-CoV-2 by FDA under an Emergency Use Authorization (EUA). This EUA will remain  in effect (meaning this test can be used) for the duration of the COVID-19 declaration under Section 564(b)(1) of the Act, 21 U.S.C.section 360bbb-3(b)(1), unless the authorization is terminated  or revoked sooner.       Influenza A by PCR NEGATIVE NEGATIVE Final   Influenza B by PCR NEGATIVE NEGATIVE Final    Comment: (NOTE) The Xpert Xpress SARS-CoV-2/FLU/RSV plus assay is intended as an aid in the diagnosis of influenza from Nasopharyngeal swab specimens and should not be used as a sole basis for treatment. Nasal washings and aspirates are unacceptable for Xpert Xpress SARS-CoV-2/FLU/RSV testing.  Fact Sheet for  Patients: EntrepreneurPulse.com.au  Fact Sheet for Healthcare Providers: IncredibleEmployment.be  This test is not yet approved or cleared by the [place] FDA and has been authorized for detection and/or diagnosis of SARS-CoV-2 by FDA under an Emergency Use Authorization (EUA). This EUA will remain in effect (meaning this test can be used) for the duration of  the COVID-19 declaration under Section 564(b)(1) of the Act, 21 U.S.C. section 360bbb-3(b)(1), unless the authorization is terminated or revoked.  Performed at [facility], [place] [address], [place], [place] [address]      Time coordinating discharge: Over 30 minutes  SIGNED:   Charlynne Cousins, MD  [facility] 01/09/2021, 9:10 AM Pager   If 7PM-7AM, please contact night-coverage [email or web address] Password [email or web address]

## 2021-01-09 NOTE — Progress Notes (Deleted)
[facility] PROGRESS NOTE    Progress Note  [name] [name]  [id number] DOB: 08/16/1965 DOA: 01/03/2021 PCP: System, Provider Not In     Brief Narrative:   [name] [name] is an 55 y.o. female past medical history of stage IV right breast cancer with metastatic right axillary node and bone metastases on chemotherapy presented with nausea vomiting abdominal pain as well as diarrhea.  Underwent Port-A-Cath placement on 12/29/2018 21st chemo on 12/29/2020 who comes in complaining of nausea vomiting abdominal pain and diarrhea C. difficile negative on admission CT scan of the abdomen pelvis showed mild diffuse colitis with wall thickening negative for perforation or abscess.    Assessment/Plan:   Abdominal pain nausea and vomiting likely due to   C. difficile colitis: C. difficile positive started on oral vancomycin tentatively for 10 days. Continues to have more than 10 watery bowel movements.  We will go ahead and discontinue bank start oral Dificid.  Metastatic breast cancer: Last chemotherapy 12/29/2020. Port-A-Cath placed on 12/28/2020.  Pancytopenia: As expected last chemotherapy on 12/29/2020. Now improved.  Hypokalemia: Replete orally now improved likely due to GI losses.   Essential hypertension: Continue current regimen.  DVT prophylaxis: lovenox Family Communication:none Status is: Inpatient  Remains inpatient appropriate because:Hemodynamically unstable  Dispo: The patient is from: Home              Anticipated d/c is to: Home              Patient currently is not medically stable to d/c.   Difficult to place patient No   Code Status:     Code Status Orders  (From admission, onward)           Start     Ordered   01/03/21 2118  Full code  Continuous        01/03/21 2119           Code Status History     Date Active Date Inactive Code Status Order ID Comments User Context   04/20/2013 0213 04/21/2013 1943 Full Code [id number]  Berle Mull, MD  Inpatient   02/20/2012 1723 02/22/2012 1723 Full Code [id number]  Lawernce Ion, RN Inpatient   11/12/2011 1538 11/16/2011 2158 Full Code [id number]  Theodis Blaze, MD ED         IV Access:   Peripheral IV   Procedures and diagnostic studies:   No results found.   Medical Consultants:   None.   Subjective:    [name] [name] she had more than 10 watery bowel movements in the last 24 hours.  Objective:    Vitals:   01/08/21 1017 01/08/21 1347 01/08/21 2056 01/09/21 0525  BP: 126/76 110/85 110/72 115/72  Pulse: 64 95 74 72  Resp: '18 16 14 14  '$ Temp: 98.1 F (36.7 C) 97.8 F (36.6 C) 98.6 F (37 C) 98.9 F (37.2 C)  TempSrc: Oral Oral Oral Oral  SpO2: 95% 98% 96% 93%  Weight:      Height:       SpO2: 93 %   Intake/Output Summary (Last 24 hours) at 01/09/2021 0902 Last data filed at 01/09/2021 [id number] Gross per 24 hour  Intake 1995.94 ml  Output --  Net 1995.94 ml    Filed Weights   01/03/21 2210  Weight: 104.5 kg    Exam: General exam: In no acute distress. Respiratory system: Good air movement and clear to auscultation. Cardiovascular system: S1 & S2 heard, RRR. No JVD. Gastrointestinal system: Abdomen is  nondistended, soft and nontender.  Extremities: No pedal edema. Skin: No rashes, lesions or ulcers  Data Reviewed:    Labs: Basic Metabolic Panel: Recent Labs  Lab 01/05/21 0513 01/06/21 0539 01/07/21 0528 01/08/21 0544 01/09/21 0759  NA 138 137 138 136 135  K 3.5 3.8 3.8 3.5 3.6  CL 99 99 101 102 104  CO2 '28 27 26 24 23  '$ GLUCOSE 101* 92 93 100* 93  BUN '8 9 9 13 9  '$ CREATININE 0.69 0.69 0.71 0.78 0.77  CALCIUM 9.1 8.9 9.0 8.9 8.5*  MG 1.9 2.0 2.0 2.0 2.0    GFR Estimated Creatinine Clearance: 98.2 mL/min (by C-G formula based on SCr of 0.77 mg/dL). Liver Function Tests: Recent Labs  Lab 01/05/21 0513 01/06/21 0539 01/07/21 0528 01/08/21 0544 01/09/21 0759  AST 36 35 36 34 36  ALT 61* 64* 66* 62* 61*  ALKPHOS 80 79 88 89  85  BILITOT 1.3* 0.7 0.6 0.5 0.4  PROT 6.5 6.5 6.7 6.5 6.4*  ALBUMIN 3.5 3.5 3.6 3.5 3.5    Recent Labs  Lab 01/03/21 1428  LIPASE 27    No results for input(s): AMMONIA in the last 168 hours. Coagulation profile No results for input(s): INR, PROTIME in the last 168 hours. COVID-19 Labs  No results for input(s): DDIMER, FERRITIN, LDH, CRP in the last 72 hours.  Lab Results  Component Value Date   [place] NEGATIVE 01/03/2021    CBC: Recent Labs  Lab 01/03/21 1428 01/04/21 0515 01/05/21 0513 01/06/21 0539 01/07/21 0528 01/08/21 0544  WBC 2.8* 1.8* 2.7* 6.3 9.6 9.6  NEUTROABS 1.7  --  1.0* 3.8 7.1 7.2  HGB 14.1 11.8* 12.7 12.4 13.2 12.6  HCT 42.7 37.6 39.2 38.2 41.6 39.4  MCV 81.8 84.3 83.9 84.0 84.9 84.7  PLT 145* 106* 102* 116* 120* 121*    Cardiac Enzymes: No results for input(s): CKTOTAL, CKMB, CKMBINDEX, TROPONINI in the last 168 hours. BNP (last 3 results) No results for input(s): PROBNP in the last 8760 hours. CBG: No results for input(s): GLUCAP in the last 168 hours. D-Dimer: No results for input(s): DDIMER in the last 72 hours. Hgb A1c: No results for input(s): HGBA1C in the last 72 hours. Lipid Profile: No results for input(s): CHOL, HDL, LDLCALC, TRIG, CHOLHDL, LDLDIRECT in the last 72 hours. Thyroid function studies: No results for input(s): TSH, T4TOTAL, T3FREE, THYROIDAB in the last 72 hours.  Invalid input(s): FREET3 Anemia work up: No results for input(s): VITAMINB12, FOLATE, FERRITIN, TIBC, IRON, RETICCTPCT in the last 72 hours. Sepsis Labs: Recent Labs  Lab 01/05/21 0513 01/06/21 0539 01/07/21 0528 01/08/21 0544  WBC 2.7* 6.3 9.6 9.6    Microbiology Recent Results (from the past 240 hour(s))  C Difficile Quick Screen w PCR reflex     Status: Abnormal   Collection Time: 01/03/21  1:58 PM   Specimen: Stool  Result Value Ref Range Status   C Diff antigen POSITIVE (A) NEGATIVE Final   C Diff toxin POSITIVE (A) NEGATIVE Final    C Diff interpretation Toxin producing C. difficile detected.  Final    Comment: CRITICAL RESULT CALLED TO, READ BACK BY AND VERIFIED WITH: HICKS,W RN '@1946'$  ON 01/03/21 JACKSON,K Performed at [facility], [place] [address], [place], [place] [address]   Gastrointestinal Panel by PCR , Stool     Status: None   Collection Time: 01/03/21  6:30 PM   Specimen: Stool  Result Value Ref Range Status   Campylobacter species  NOT DETECTED NOT DETECTED Final   Plesimonas shigelloides NOT DETECTED NOT DETECTED Final   Salmonella species NOT DETECTED NOT DETECTED Final   Yersinia enterocolitica NOT DETECTED NOT DETECTED Final   Vibrio species NOT DETECTED NOT DETECTED Final   Vibrio cholerae NOT DETECTED NOT DETECTED Final   Enteroaggregative E coli (EAEC) NOT DETECTED NOT DETECTED Final   Enteropathogenic E coli (EPEC) NOT DETECTED NOT DETECTED Final   Enterotoxigenic E coli (ETEC) NOT DETECTED NOT DETECTED Final   Shiga like toxin producing E coli (STEC) NOT DETECTED NOT DETECTED Final   Shigella/Enteroinvasive E coli (EIEC) NOT DETECTED NOT DETECTED Final   Cryptosporidium NOT DETECTED NOT DETECTED Final   Cyclospora cayetanensis NOT DETECTED NOT DETECTED Final   Entamoeba histolytica NOT DETECTED NOT DETECTED Final   Giardia lamblia NOT DETECTED NOT DETECTED Final   Adenovirus F40/41 NOT DETECTED NOT DETECTED Final   Astrovirus NOT DETECTED NOT DETECTED Final   Norovirus GI/GII NOT DETECTED NOT DETECTED Final   Rotavirus A NOT DETECTED NOT DETECTED Final   Sapovirus (I, II, IV, and V) NOT DETECTED NOT DETECTED Final    Comment: Performed at [facility], [address] [place],  [address]  Resp Panel by RT-PCR (Flu A&B, Covid) Nasopharyngeal Swab     Status: None   Collection Time: 01/03/21  7:56 PM   Specimen: Nasopharyngeal Swab; Nasopharyngeal(NP) swabs in vial transport medium  Result Value Ref Range Status   SARS Coronavirus 2 by RT PCR NEGATIVE NEGATIVE  Final    Comment: (NOTE) SARS-CoV-2 target nucleic acids are NOT DETECTED.  The SARS-CoV-2 RNA is generally detectable in upper respiratory specimens during the acute phase of infection. The lowest concentration of SARS-CoV-2 viral copies this assay can detect is 138 copies/mL. A negative result does not preclude SARS-Cov-2 infection and should not be used as the sole basis for treatment or other patient management decisions. A negative result may occur with  improper specimen collection/handling, submission of specimen other than nasopharyngeal swab, presence of viral mutation(s) within the areas targeted by this assay, and inadequate number of viral copies(<138 copies/mL). A negative result must be combined with clinical observations, patient history, and epidemiological information. The expected result is Negative.  Fact Sheet for Patients:  EntrepreneurPulse.com.au  Fact Sheet for Healthcare Providers:  IncredibleEmployment.be  This test is no t yet approved or cleared by the [place] FDA and  has been authorized for detection and/or diagnosis of SARS-CoV-2 by FDA under an Emergency Use Authorization (EUA). This EUA will remain  in effect (meaning this test can be used) for the duration of the COVID-19 declaration under Section 564(b)(1) of the Act, 21 U.S.C.section 360bbb-3(b)(1), unless the authorization is terminated  or revoked sooner.       Influenza A by PCR NEGATIVE NEGATIVE Final   Influenza B by PCR NEGATIVE NEGATIVE Final    Comment: (NOTE) The Xpert Xpress SARS-CoV-2/FLU/RSV plus assay is intended as an aid in the diagnosis of influenza from Nasopharyngeal swab specimens and should not be used as a sole basis for treatment. Nasal washings and aspirates are unacceptable for Xpert Xpress SARS-CoV-2/FLU/RSV testing.  Fact Sheet for Patients: EntrepreneurPulse.com.au  Fact Sheet for Healthcare  Providers: IncredibleEmployment.be  This test is not yet approved or cleared by the [place] FDA and has been authorized for detection and/or diagnosis of SARS-CoV-2 by FDA under an Emergency Use Authorization (EUA). This EUA will remain in effect (meaning this test can be used) for the duration of the  COVID-19 declaration under Section 564(b)(1) of the Act, 21 U.S.C. section 360bbb-3(b)(1), unless the authorization is terminated or revoked.  Performed at [facility], [place] [address], [place], [place] [address]      Medications:    Chlorhexidine Gluconate Cloth  6 each Topical Daily   enoxaparin (LOVENOX) injection  50 mg Subcutaneous Q24H   fidaxomicin  200 mg Oral BID   lisinopril  20 mg Oral Daily   And   hydrochlorothiazide  25 mg Oral Daily   neomycin-bacitracin-polymyxin   Topical BID   rosuvastatin  10 mg Oral QHS   Continuous Infusions:  sodium chloride 75 mL/hr at 01/08/21 2324      LOS: 5 days   Charlynne Cousins  [facility]  01/09/2021, 9:02 AM

## 2021-01-09 NOTE — Discharge Instructions (Signed)
[name] [name] was admitted to the Hospital on 01/03/2021 and Discharged on Discharge Date 01/09/2021 and should be excused from work/school   for 10  days starting 01/03/2021 , may return to work/school without any restrictions.  Call Bess Harvest MD, [facility] [phone] with questions.  Charlynne Cousins M.D on 01/09/2021,at 12:28 PM  [facility] Office  [phone]

## 2021-01-09 NOTE — Discharge Summary (Signed)
Physician Discharge Summary  [name] [name] [id number] DOB: 08-19-1965 DOA: 01/03/2021  PCP: System, Provider Not In  Admit date: 01/03/2021  Discharge date: 01/09/2021  Admitted From: Home  Disposition: Home  Recommendations for Outpatient Follow-up:  Follow up with PCP in 1-2 weeks Please obtain BMP/CBC in one week Home Health:no  Equipment/Devices:none  Discharge Condition:Stable  CODE STATUS:Full  Diet recommendation: Heart Healthy  Brief/Interim Summary:  55 y.o. female past medical history of stage IV right breast cancer with metastatic right axillary node and bone metastases on chemotherapy presented with nausea vomiting abdominal pain as well as diarrhea. Underwent Port-A-Cath placement on 12/29/2018 21st chemo on 12/29/2020 who comes in complaining of nausea vomiting abdominal pain and diarrhea C. difficile negative on admission CT scan of the abdomen pelvis showed mild diffuse colitis with wall thickening negative for perforation or abscess.  Discharge Diagnoses:  Principal Problem:  C. difficile colitis  Active Problems:  Hypertension  Hyperlipidemia  Metastatic breast cancer ([place])  Pancytopenia ([facility])  Hypokalemia   C. difficile colitis: C. difficile positive on admission was started on oral Vanco with no improvement. She was changed to oral Dificid and her bowel movements improved and they were more formed so she will continue Dificid as an outpatient for 9 more days.  Metastatic breast cancer: Follow-up with oncology as an outpatient.  Pancytopenia: I suspect that last chemotherapy was 12/29/2020, improved.  Hypokalemia: Replete orally now resolved likely due to GI losses.  Essential hypertension: Continue current regimen no changes made.  Discharge Instructions  Discharge Instructions     Diet - low sodium heart healthy  Complete by: As directed    Increase activity slowly  Complete by: As directed       Allergies as of 01/09/2021      Reactions   Amlodipine  Swelling, Other (See Comments)   Only the feet became swollen   Morphine And Related Hives, Nausea And Vomiting, Nausea Only, Other (See Comments)   Headaches and sweats, also   Codeine Hives, Nausea Only, Other (See Comments)   Headaches, also        Medication List     TAKE these medications    aspirin-acetaminophen-caffeine 250-250-65 MG tablet  Commonly known as: EXCEDRIN MIGRAINE  Take 2 tablets by mouth every 6 (six) hours as needed for headache.   ferrous sulfate 325 (65 FE) MG tablet  Take 325 mg by mouth 2 (two) times a week.   fidaxomicin 200 MG Tabs tablet  Commonly known as: DIFICID  Take 1 tablet (200 mg total) by mouth 2 (two) times daily for 9 days.   lisinopril-hydrochlorothiazide 20-25 MG tablet  Commonly known as: ZESTORETIC  Take 1 tablet by mouth daily.   naproxen sodium 220 MG tablet  Commonly known as: ALEVE  Take 220-440 mg by mouth 2 (two) times daily as needed (for headaches).   neomycin-bacitracin-polymyxin Oint  Commonly known as: NEOSPORIN  Apply 1 application topically 2 (two) times daily for 5 days.   POTASSIUM PO  Take 1 tablet by mouth every other day.   rosuvastatin 10 MG tablet  Commonly known as: CRESTOR  Take 10 mg by mouth at bedtime.   VITAMIN B12 PO  Take 1 tablet by mouth daily.   VITAMIN D3 PO  Take 1 capsule by mouth daily.              Allergies  Allergen Reactions   Amlodipine Swelling and Other (See Comments)    Only the feet became swollen  Morphine And Related Hives, Nausea And Vomiting, Nausea Only and Other (See Comments)    Headaches and sweats, also    Codeine Hives, Nausea Only and Other (See Comments)    Headaches, also   Consultations:  None Procedures/Studies:  CT ABDOMEN PELVIS W CONTRAST  Result Date: 01/03/2021  CLINICAL DATA: Diarrhea abdominal pain EXAM: CT ABDOMEN AND PELVIS WITH CONTRAST TECHNIQUE: Multidetector CT imaging of the abdomen and pelvis was performed using the standard protocol  following bolus administration of intravenous contrast. CONTRAST: 78m OMNIPAQUE IOHEXOL 350 MG/ML SOLN COMPARISON: CT 12/21/2020 FINDINGS: Lower chest: Lung bases demonstrate no acute consolidation or effusion. Cardiac size upper limits of normal. Hepatobiliary: No focal liver abnormality is seen. Status post cholecystectomy. No biliary dilatation. Pancreas: Unremarkable. No pancreatic ductal dilatation or surrounding inflammatory changes. Spleen: Normal in size without focal abnormality. Adrenals/Urinary Tract: Adrenal glands are unremarkable. Kidneys are normal, without renal calculi, focal lesion, or hydronephrosis. Bladder is unremarkable. Stomach/Bowel: The stomach is nonenlarged. No dilated small bowel. Mild diffuse colon wall thickening with mucosal enhancement consistent with colitis. Negative appendix. Vascular/Lymphatic: Nonaneurysmal aorta. Similar right cardio phrenic fat pad lymph node. No increasing adenopathy. Reproductive: Status post hysterectomy. No adnexal masses. Other: Negative for free air or free fluid. Small fat containing umbilical hernia Musculoskeletal: No acute osseous abnormality IMPRESSION: 1. Interim development of mild diffuse colon wall thickening with mucosal enhancement consistent with colitis of infectious or inflammatory etiology, to include C difficile. Negative for perforation or abscess. Electronically Signed By: KDonavan FoilM.D. On: 01/03/2021 19:25   Imaging Results    (Echo, Carotid, EGD, Colonoscopy, ERCP)  Subjective:  No complaints  Discharge Exam:      Vitals:   01/08/21 2056 01/09/21 0525  BP: 110/72 115/72  Pulse: 74 72  Resp: 14 14  Temp: 98.6 F (37 C) 98.9 F (37.2 C)  SpO2: 96% 93%         Vitals:   01/08/21 1017 01/08/21 1347 01/08/21 2056 01/09/21 0525  BP: 126/76 110/85 110/72 115/72  Pulse: 64 95 74 72  Resp: '18 16 14 14  '$ Temp: 98.1 F (36.7 C) 97.8 F (36.6 C) 98.6 F (37 C) 98.9 F (37.2 C)  TempSrc: Oral Oral Oral Oral   SpO2: 95% 98% 96% 93%  Weight:      Height:       General: Pt is alert, awake, not in acute distress  Cardiovascular: RRR, S1/S2 +, no rubs, no gallops  Respiratory: CTA bilaterally, no wheezing, no rhonchi  Abdominal: Soft, NT, ND, bowel sounds +  Extremities: no edema, no cyanosis  The results of significant diagnostics from this hospitalization (including imaging, microbiology, ancillary and laboratory) are listed below for reference.   Microbiology:         Recent Results (from the past 240 hour(s))  C Difficile Quick Screen w PCR reflex Status: Abnormal   Collection Time: 01/03/21 1:58 PM   Specimen: Stool  Result Value Ref Range Status   C Diff antigen POSITIVE (A) NEGATIVE Final   C Diff toxin POSITIVE (A) NEGATIVE Final   C Diff interpretation Toxin producing C. difficile detected.  Final    Comment: CRITICAL RESULT CALLED TO, READ BACK BY AND VERIFIED WITH:  HICKS,W RN '@1946'$  ON 01/03/21 JACKSON,K  Performed at [facility] [address] [place] [address]  Gastrointestinal Panel by PCR , Stool Status: None   Collection Time: 01/03/21 6:30 PM   Specimen: Stool  Result Value Ref Range Status  Campylobacter species NOT DETECTED NOT DETECTED Final   Plesimonas shigelloides NOT DETECTED NOT DETECTED Final   Salmonella species NOT DETECTED NOT DETECTED Final   Yersinia enterocolitica NOT DETECTED NOT DETECTED Final   Vibrio species NOT DETECTED NOT DETECTED Final   Vibrio cholerae NOT DETECTED NOT DETECTED Final   Enteroaggregative E coli (EAEC) NOT DETECTED NOT DETECTED Final   Enteropathogenic E coli (EPEC) NOT DETECTED NOT DETECTED Final   Enterotoxigenic E coli (ETEC) NOT DETECTED NOT DETECTED Final   Shiga like toxin producing E coli (STEC) NOT DETECTED NOT DETECTED Final   Shigella/Enteroinvasive E coli (EIEC) NOT DETECTED NOT DETECTED Final   Cryptosporidium NOT DETECTED NOT DETECTED Final   Cyclospora cayetanensis NOT DETECTED NOT  DETECTED Final   Entamoeba histolytica NOT DETECTED NOT DETECTED Final   Giardia lamblia NOT DETECTED NOT DETECTED Final   Adenovirus F40/41 NOT DETECTED NOT DETECTED Final   Astrovirus NOT DETECTED NOT DETECTED Final   Norovirus GI/GII NOT DETECTED NOT DETECTED Final   Rotavirus A NOT DETECTED NOT DETECTED Final   Sapovirus (I, II, IV, and V) NOT DETECTED NOT DETECTED Final    Comment: Performed at [facility], [address] [place], [place] [address]  Resp Panel by RT-PCR (Flu A&B, Covid) Nasopharyngeal Swab Status: None   Collection Time: 01/03/21 7:56 PM   Specimen: Nasopharyngeal Swab; Nasopharyngeal(NP) swabs in vial transport medium  Result Value Ref Range Status   SARS Coronavirus 2 by RT PCR NEGATIVE NEGATIVE Final    Comment: (NOTE)  SARS-CoV-2 target nucleic acids are NOT DETECTED.  The SARS-CoV-2 RNA is generally detectable in upper respiratory  specimens during the acute phase of infection. The lowest  concentration of SARS-CoV-2 viral copies this assay can detect is  138 copies/mL. A negative result does not preclude SARS-Cov-2  infection and should not be used as the sole basis for treatment or  other patient management decisions. A negative result may occur with  improper specimen collection/handling, submission of specimen other  than nasopharyngeal swab, presence of viral mutation(s) within the  areas targeted by this assay, and inadequate number of viral  copies(<138 copies/mL). A negative result must be combined with  clinical observations, patient history, and epidemiological  information. The expected result is Negative.  Fact Sheet for Patients:  EntrepreneurPulse.com.au  Fact Sheet for Healthcare Providers:  IncredibleEmployment.be  This test is no t yet approved or cleared by the [place] FDA and  has been authorized for detection and/or diagnosis of SARS-CoV-2 by  FDA under an Emergency Use Authorization  (EUA). This EUA will remain  in effect (meaning this test can be used) for the duration of the  COVID-19 declaration under Section 564(b)(1) of the Act, 21  U.S.C.section 360bbb-3(b)(1), unless the authorization is terminated  or revoked sooner.    Influenza A by PCR NEGATIVE NEGATIVE Final   Influenza B by PCR NEGATIVE NEGATIVE Final    Comment: (NOTE)  The Xpert Xpress SARS-CoV-2/FLU/RSV plus assay is intended as an aid  in the diagnosis of influenza from Nasopharyngeal swab specimens and  should not be used as a sole basis for treatment. Nasal washings and  aspirates are unacceptable for Xpert Xpress SARS-CoV-2/FLU/RSV  testing.  Fact Sheet for Patients:  EntrepreneurPulse.com.au  Fact Sheet for Healthcare Providers:  IncredibleEmployment.be  This test is not yet approved or cleared by the [place] FDA and  has been authorized for detection and/or diagnosis of SARS-CoV-2 by  FDA under an Emergency Use Authorization (EUA).  This EUA will remain  in effect (meaning this test can be used) for the duration of the  COVID-19 declaration under Section 564(b)(1) of the Act, 21 U.S.C.  section 360bbb-3(b)(1), unless the authorization is terminated or  revoked.  Performed at [facility], [place] [address]  [place],  [address]    Labs:  BNP (last 3 results)  Recent Labs (within last 365 days)    Basic Metabolic Panel:  Last Labs                                                                                              Liver Function Tests:  Last Labs                                                                       Last Labs                   Last Labs     CBC:  Last Labs                                                                      Cardiac Enzymes:  Last Labs     BNP:  Last Labs     CBG:  Last Labs     D-Dimer  Recent Labs (last 2 labs)     Hgb A1c  Recent  Labs (last 2 labs)     Lipid Profile  Recent Labs (last 2 labs)     Thyroid function studies  Recent Labs (last 2 labs)     Anemia work up  [employer] (last 2 labs)     Urinalysis  Labs (Brief)                                                                                        Sepsis Labs  Last Labs     Microbiology         Recent Results (from the past 240 hour(s))  C Difficile Quick Screen w PCR reflex Status: Abnormal   Collection Time: 01/03/21 1:58 PM   Specimen: Stool  Result Value Ref Range Status   C Diff antigen POSITIVE (A) NEGATIVE Final   C Diff toxin POSITIVE (A) NEGATIVE Final   C Diff  interpretation Toxin producing C. difficile detected.  Final    Comment: CRITICAL RESULT CALLED TO, READ BACK BY AND VERIFIED WITH:  HICKS,W RN '@1946'$  ON 01/03/21 JACKSON,K  Performed at [facility], [place] [address], [place], [place] [address]   Gastrointestinal Panel by PCR , Stool Status: None   Collection Time: 01/03/21 6:30 PM   Specimen: Stool  Result Value Ref Range Status   Campylobacter species NOT DETECTED NOT DETECTED Final   Plesimonas shigelloides NOT DETECTED NOT DETECTED Final   Salmonella species NOT DETECTED NOT DETECTED Final   Yersinia enterocolitica NOT DETECTED NOT DETECTED Final   Vibrio species NOT DETECTED NOT DETECTED Final   Vibrio cholerae NOT DETECTED NOT DETECTED Final   Enteroaggregative E coli (EAEC) NOT DETECTED NOT DETECTED Final   Enteropathogenic E coli (EPEC) NOT DETECTED NOT DETECTED Final   Enterotoxigenic E coli (ETEC) NOT DETECTED NOT DETECTED Final   Shiga like toxin producing E coli (STEC) NOT DETECTED NOT DETECTED Final   Shigella/Enteroinvasive E coli (EIEC) NOT DETECTED NOT DETECTED Final   Cryptosporidium NOT DETECTED NOT DETECTED Final   Cyclospora cayetanensis NOT DETECTED NOT DETECTED Final   Entamoeba histolytica NOT DETECTED NOT DETECTED Final   Giardia lamblia NOT DETECTED NOT DETECTED  Final   Adenovirus F40/41 NOT DETECTED NOT DETECTED Final   Astrovirus NOT DETECTED NOT DETECTED Final   Norovirus GI/GII NOT DETECTED NOT DETECTED Final   Rotavirus A NOT DETECTED NOT DETECTED Final   Sapovirus (I, II, IV, and V) NOT DETECTED NOT DETECTED Final    Comment: Performed at [facility] [address] [place], [place] [address]  Resp Panel by RT-PCR (Flu A&B, Covid) Nasopharyngeal Swab Status: None   Collection Time: 01/03/21 7:56 PM   Specimen: Nasopharyngeal Swab; Nasopharyngeal(NP) swabs in vial transport medium  Result Value Ref Range Status   SARS Coronavirus 2 by RT PCR NEGATIVE NEGATIVE Final    Comment: (NOTE)  SARS-CoV-2 target nucleic acids are NOT DETECTED.  The SARS-CoV-2 RNA is generally detectable in upper respiratory  specimens during the acute phase of infection. The lowest  concentration of SARS-CoV-2 viral copies this assay can detect is  138 copies/mL. A negative result does not preclude SARS-Cov-2  infection and should not be used as the sole basis for treatment or  other patient management decisions. A negative result may occur with  improper specimen collection/handling, submission of specimen other  than nasopharyngeal swab, presence of viral mutation(s) within the  areas targeted by this assay, and inadequate number of viral  copies(<138 copies/mL). A negative result must be combined with  clinical observations, patient history, and epidemiological  information. The expected result is Negative.  Fact Sheet for Patients:  EntrepreneurPulse.com.au  Fact Sheet for Healthcare Providers:  IncredibleEmployment.be  This test is no t yet approved or cleared by the [place] FDA and  has been authorized for detection and/or diagnosis of SARS-CoV-2 by  FDA under an Emergency Use Authorization (EUA). This EUA will remain  in effect (meaning this test can be used) for the duration of the  COVID-19  declaration under Section 564(b)(1) of the Act, 21  U.S.C.section 360bbb-3(b)(1), unless the authorization is terminated  or revoked sooner.    Influenza A by PCR NEGATIVE NEGATIVE Final   Influenza B by PCR NEGATIVE NEGATIVE Final    Comment: (NOTE)  The Xpert Xpress SARS-CoV-2/FLU/RSV plus assay is intended as an aid  in the diagnosis of influenza from Nasopharyngeal swab specimens and  should not be used as a sole basis for treatment. Nasal washings and  aspirates are unacceptable for Xpert Xpress SARS-CoV-2/FLU/RSV  testing.  Fact Sheet for Patients:  EntrepreneurPulse.com.au  Fact Sheet for Healthcare Providers:  IncredibleEmployment.be  This test is not yet approved or cleared by the [place] FDA and  has been authorized for detection and/or diagnosis of SARS-CoV-2 by  FDA under an Emergency Use Authorization (EUA). This EUA will remain  in effect (meaning this test can be used) for the duration of the  COVID-19 declaration under Section 564(b)(1) of the Act, 21 U.S.C.  section 360bbb-3(b)(1), unless the authorization is terminated or  revoked.  Performed at [facility], [place] [address],  [place], [place] [address]

## 2024-02-26 NOTE — Progress Notes (Signed)
 Transitional Care Management   This patient has been identified as being eligible for the Care Transitions program, a post-hospital discharge program designed to decrease readmission risk and increase continuity of care for patients. Awaiting discharge determination.   Patient DC from [facility] on 02/23/24 And was discharged to home   Due to low risk score patient is not eligible for enrollment.  Will close case at this time but happy to assist patient in future if needs arise.
